# Patient Record
Sex: Female | Born: 1950 | Race: Black or African American | Hispanic: No | Marital: Married | Smoking: Never smoker
Health system: Southern US, Community
[De-identification: ages and names within clinical notes are randomized; demographics above are authoritative.]

## PROBLEM LIST (undated history)

## (undated) DIAGNOSIS — Z923 Personal history of irradiation: Secondary | ICD-10-CM

## (undated) DIAGNOSIS — I509 Heart failure, unspecified: Secondary | ICD-10-CM

## (undated) DIAGNOSIS — C801 Malignant (primary) neoplasm, unspecified: Secondary | ICD-10-CM

## (undated) DIAGNOSIS — I10 Essential (primary) hypertension: Secondary | ICD-10-CM

## (undated) DIAGNOSIS — E119 Type 2 diabetes mellitus without complications: Secondary | ICD-10-CM

## (undated) DIAGNOSIS — Z9221 Personal history of antineoplastic chemotherapy: Secondary | ICD-10-CM

## (undated) DIAGNOSIS — C50919 Malignant neoplasm of unspecified site of unspecified female breast: Secondary | ICD-10-CM

## (undated) HISTORY — PX: BREAST SURGERY: SHX581

## (undated) HISTORY — PX: BREAST BIOPSY: SHX20

## (undated) HISTORY — PX: BREAST LUMPECTOMY: SHX2

---

## 2004-04-18 ENCOUNTER — Ambulatory Visit (HOSPITAL_COMMUNITY): Admission: RE | Admit: 2004-04-18 | Discharge: 2004-04-18 | Payer: Self-pay | Admitting: Gastroenterology

## 2006-09-05 ENCOUNTER — Ambulatory Visit (HOSPITAL_COMMUNITY): Admission: RE | Admit: 2006-09-05 | Discharge: 2006-09-05 | Payer: Self-pay | Admitting: Gastroenterology

## 2010-06-19 ENCOUNTER — Encounter: Payer: Self-pay | Admitting: Hematology and Oncology

## 2012-07-09 ENCOUNTER — Other Ambulatory Visit (HOSPITAL_COMMUNITY): Payer: Self-pay | Admitting: Internal Medicine

## 2012-07-09 DIAGNOSIS — Z1231 Encounter for screening mammogram for malignant neoplasm of breast: Secondary | ICD-10-CM

## 2012-07-15 ENCOUNTER — Ambulatory Visit (HOSPITAL_COMMUNITY)
Admission: RE | Admit: 2012-07-15 | Discharge: 2012-07-15 | Disposition: A | Discharge status: Home / Self Care | Payer: Medicaid Other | Source: Ambulatory Visit | Attending: Internal Medicine | Admitting: Internal Medicine

## 2012-07-15 DIAGNOSIS — Z1231 Encounter for screening mammogram for malignant neoplasm of breast: Secondary | ICD-10-CM | POA: Insufficient documentation

## 2012-07-17 ENCOUNTER — Other Ambulatory Visit: Payer: Self-pay | Admitting: Internal Medicine

## 2012-07-17 DIAGNOSIS — R928 Other abnormal and inconclusive findings on diagnostic imaging of breast: Secondary | ICD-10-CM

## 2012-07-29 ENCOUNTER — Ambulatory Visit
Admission: RE | Admit: 2012-07-29 | Discharge: 2012-07-29 | Disposition: A | Discharge status: Home / Self Care | Payer: Medicaid Other | Source: Ambulatory Visit | Attending: Internal Medicine | Admitting: Internal Medicine

## 2012-07-29 DIAGNOSIS — R928 Other abnormal and inconclusive findings on diagnostic imaging of breast: Secondary | ICD-10-CM

## 2012-08-12 ENCOUNTER — Other Ambulatory Visit: Payer: Self-pay | Admitting: Internal Medicine

## 2012-08-12 DIAGNOSIS — N6310 Unspecified lump in the right breast, unspecified quadrant: Secondary | ICD-10-CM

## 2012-08-22 ENCOUNTER — Other Ambulatory Visit: Payer: Self-pay | Admitting: Internal Medicine

## 2012-08-22 ENCOUNTER — Ambulatory Visit
Admission: RE | Admit: 2012-08-22 | Discharge: 2012-08-22 | Disposition: A | Discharge status: Home / Self Care | Payer: Medicaid Other | Source: Ambulatory Visit | Attending: Internal Medicine | Admitting: Internal Medicine

## 2012-08-22 DIAGNOSIS — N6310 Unspecified lump in the right breast, unspecified quadrant: Secondary | ICD-10-CM

## 2014-03-03 ENCOUNTER — Other Ambulatory Visit: Payer: Self-pay

## 2014-03-03 DIAGNOSIS — Z1239 Encounter for other screening for malignant neoplasm of breast: Secondary | ICD-10-CM

## 2014-03-19 ENCOUNTER — Other Ambulatory Visit: Payer: Self-pay

## 2014-03-19 DIAGNOSIS — Z1231 Encounter for screening mammogram for malignant neoplasm of breast: Secondary | ICD-10-CM

## 2014-03-25 ENCOUNTER — Ambulatory Visit: Payer: Medicaid Other

## 2015-02-16 ENCOUNTER — Ambulatory Visit: Payer: Medicaid Other

## 2015-03-18 ENCOUNTER — Ambulatory Visit
Admission: RE | Admit: 2015-03-18 | Discharge: 2015-03-18 | Disposition: A | Discharge status: Home / Self Care | Payer: Medicaid Other | Source: Ambulatory Visit

## 2015-03-18 DIAGNOSIS — Z1231 Encounter for screening mammogram for malignant neoplasm of breast: Secondary | ICD-10-CM

## 2016-04-28 ENCOUNTER — Other Ambulatory Visit: Payer: Self-pay | Admitting: Family Medicine

## 2016-05-17 ENCOUNTER — Other Ambulatory Visit: Payer: Self-pay | Admitting: Family Medicine

## 2016-05-17 DIAGNOSIS — Z1231 Encounter for screening mammogram for malignant neoplasm of breast: Secondary | ICD-10-CM

## 2016-06-07 ENCOUNTER — Ambulatory Visit: Payer: Medicaid Other

## 2016-10-10 ENCOUNTER — Other Ambulatory Visit: Payer: Self-pay | Admitting: Family Medicine

## 2016-10-10 DIAGNOSIS — Z853 Personal history of malignant neoplasm of breast: Secondary | ICD-10-CM

## 2016-10-10 DIAGNOSIS — Z1231 Encounter for screening mammogram for malignant neoplasm of breast: Secondary | ICD-10-CM

## 2016-10-26 ENCOUNTER — Ambulatory Visit: Payer: Medicaid Other

## 2017-08-07 ENCOUNTER — Other Ambulatory Visit: Payer: Self-pay

## 2017-08-07 ENCOUNTER — Inpatient Hospital Stay (HOSPITAL_BASED_OUTPATIENT_CLINIC_OR_DEPARTMENT_OTHER)
Admission: EM | Admit: 2017-08-07 | Discharge: 2017-08-13 | DRG: 189 | Disposition: A | Discharge status: Home Health | Payer: Medicare Other | Attending: Internal Medicine | Admitting: Internal Medicine

## 2017-08-07 ENCOUNTER — Encounter (HOSPITAL_BASED_OUTPATIENT_CLINIC_OR_DEPARTMENT_OTHER): Payer: Self-pay | Admitting: Emergency Medicine

## 2017-08-07 ENCOUNTER — Emergency Department (HOSPITAL_BASED_OUTPATIENT_CLINIC_OR_DEPARTMENT_OTHER): Payer: Medicare Other

## 2017-08-07 DIAGNOSIS — Z7984 Long term (current) use of oral hypoglycemic drugs: Secondary | ICD-10-CM

## 2017-08-07 DIAGNOSIS — I4581 Long QT syndrome: Secondary | ICD-10-CM | POA: Diagnosis present

## 2017-08-07 DIAGNOSIS — R509 Fever, unspecified: Secondary | ICD-10-CM

## 2017-08-07 DIAGNOSIS — D72829 Elevated white blood cell count, unspecified: Secondary | ICD-10-CM

## 2017-08-07 DIAGNOSIS — M109 Gout, unspecified: Secondary | ICD-10-CM | POA: Diagnosis present

## 2017-08-07 DIAGNOSIS — R911 Solitary pulmonary nodule: Secondary | ICD-10-CM | POA: Diagnosis present

## 2017-08-07 DIAGNOSIS — D649 Anemia, unspecified: Secondary | ICD-10-CM | POA: Diagnosis present

## 2017-08-07 DIAGNOSIS — R4781 Slurred speech: Secondary | ICD-10-CM | POA: Diagnosis present

## 2017-08-07 DIAGNOSIS — Z853 Personal history of malignant neoplasm of breast: Secondary | ICD-10-CM | POA: Diagnosis not present

## 2017-08-07 DIAGNOSIS — N183 Chronic kidney disease, stage 3 unspecified: Secondary | ICD-10-CM | POA: Diagnosis present

## 2017-08-07 DIAGNOSIS — I509 Heart failure, unspecified: Secondary | ICD-10-CM | POA: Diagnosis present

## 2017-08-07 DIAGNOSIS — I130 Hypertensive heart and chronic kidney disease with heart failure and stage 1 through stage 4 chronic kidney disease, or unspecified chronic kidney disease: Secondary | ICD-10-CM | POA: Diagnosis present

## 2017-08-07 DIAGNOSIS — I340 Nonrheumatic mitral (valve) insufficiency: Secondary | ICD-10-CM | POA: Diagnosis not present

## 2017-08-07 DIAGNOSIS — W19XXXA Unspecified fall, initial encounter: Secondary | ICD-10-CM

## 2017-08-07 DIAGNOSIS — R591 Generalized enlarged lymph nodes: Secondary | ICD-10-CM | POA: Diagnosis present

## 2017-08-07 DIAGNOSIS — E875 Hyperkalemia: Secondary | ICD-10-CM | POA: Diagnosis present

## 2017-08-07 DIAGNOSIS — E785 Hyperlipidemia, unspecified: Secondary | ICD-10-CM | POA: Diagnosis present

## 2017-08-07 DIAGNOSIS — J209 Acute bronchitis, unspecified: Secondary | ICD-10-CM | POA: Diagnosis present

## 2017-08-07 DIAGNOSIS — E1122 Type 2 diabetes mellitus with diabetic chronic kidney disease: Secondary | ICD-10-CM | POA: Diagnosis present

## 2017-08-07 DIAGNOSIS — R0902 Hypoxemia: Secondary | ICD-10-CM | POA: Insufficient documentation

## 2017-08-07 DIAGNOSIS — E1121 Type 2 diabetes mellitus with diabetic nephropathy: Secondary | ICD-10-CM | POA: Diagnosis not present

## 2017-08-07 DIAGNOSIS — D631 Anemia in chronic kidney disease: Secondary | ICD-10-CM | POA: Diagnosis present

## 2017-08-07 DIAGNOSIS — Y92002 Bathroom of unspecified non-institutional (private) residence single-family (private) house as the place of occurrence of the external cause: Secondary | ICD-10-CM | POA: Diagnosis not present

## 2017-08-07 DIAGNOSIS — Z79899 Other long term (current) drug therapy: Secondary | ICD-10-CM | POA: Diagnosis not present

## 2017-08-07 DIAGNOSIS — J9601 Acute respiratory failure with hypoxia: Principal | ICD-10-CM

## 2017-08-07 DIAGNOSIS — Z6841 Body Mass Index (BMI) 40.0 and over, adult: Secondary | ICD-10-CM | POA: Diagnosis not present

## 2017-08-07 DIAGNOSIS — E669 Obesity, unspecified: Secondary | ICD-10-CM | POA: Diagnosis present

## 2017-08-07 DIAGNOSIS — I10 Essential (primary) hypertension: Secondary | ICD-10-CM | POA: Diagnosis present

## 2017-08-07 DIAGNOSIS — R7989 Other specified abnormal findings of blood chemistry: Secondary | ICD-10-CM | POA: Diagnosis present

## 2017-08-07 DIAGNOSIS — E1129 Type 2 diabetes mellitus with other diabetic kidney complication: Secondary | ICD-10-CM | POA: Diagnosis present

## 2017-08-07 DIAGNOSIS — R609 Edema, unspecified: Secondary | ICD-10-CM | POA: Diagnosis not present

## 2017-08-07 HISTORY — DX: Essential (primary) hypertension: I10

## 2017-08-07 HISTORY — DX: Malignant (primary) neoplasm, unspecified: C80.1

## 2017-08-07 HISTORY — DX: Heart failure, unspecified: I50.9

## 2017-08-07 HISTORY — DX: Type 2 diabetes mellitus without complications: E11.9

## 2017-08-07 LAB — CBC WITH DIFFERENTIAL/PLATELET
BASOS PCT: 0 %
Basophils Absolute: 0 10*3/uL (ref 0.0–0.1)
Eosinophils Absolute: 0 10*3/uL (ref 0.0–0.7)
Eosinophils Relative: 0 %
HEMATOCRIT: 35.3 % — AB (ref 36.0–46.0)
HEMOGLOBIN: 11.7 g/dL — AB (ref 12.0–15.0)
Lymphocytes Relative: 18 %
Lymphs Abs: 3.1 10*3/uL (ref 0.7–4.0)
MCH: 30.5 pg (ref 26.0–34.0)
MCHC: 33.1 g/dL (ref 30.0–36.0)
MCV: 91.9 fL (ref 78.0–100.0)
Monocytes Absolute: 1.3 10*3/uL — ABNORMAL HIGH (ref 0.1–1.0)
Monocytes Relative: 8 %
NEUTROS ABS: 12.7 10*3/uL — AB (ref 1.7–7.7)
NEUTROS PCT: 74 %
Platelets: 170 10*3/uL (ref 150–400)
RBC: 3.84 MIL/uL — AB (ref 3.87–5.11)
RDW: 14.6 % (ref 11.5–15.5)
WBC: 17.1 10*3/uL — ABNORMAL HIGH (ref 4.0–10.5)

## 2017-08-07 LAB — URINALYSIS, ROUTINE W REFLEX MICROSCOPIC
Glucose, UA: NEGATIVE mg/dL
Ketones, ur: NEGATIVE mg/dL
Leukocytes, UA: NEGATIVE
Nitrite: NEGATIVE
PROTEIN: 100 mg/dL — AB
pH: 5.5 (ref 5.0–8.0)

## 2017-08-07 LAB — COMPREHENSIVE METABOLIC PANEL
ALBUMIN: 3.6 g/dL (ref 3.5–5.0)
ALK PHOS: 65 U/L (ref 38–126)
ALT: 16 U/L (ref 14–54)
ANION GAP: 9 (ref 5–15)
AST: 35 U/L (ref 15–41)
BILIRUBIN TOTAL: 0.6 mg/dL (ref 0.3–1.2)
BUN: 22 mg/dL — ABNORMAL HIGH (ref 6–20)
CALCIUM: 8.5 mg/dL — AB (ref 8.9–10.3)
CO2: 28 mmol/L (ref 22–32)
Chloride: 99 mmol/L — ABNORMAL LOW (ref 101–111)
Creatinine, Ser: 1.7 mg/dL — ABNORMAL HIGH (ref 0.44–1.00)
GFR calc Af Amer: 35 mL/min — ABNORMAL LOW (ref >60)
GFR calc non Af Amer: 30 mL/min — ABNORMAL LOW (ref >60)
GLUCOSE: 124 mg/dL — AB (ref 65–99)
Potassium: 4 mmol/L (ref 3.5–5.1)
Sodium: 136 mmol/L (ref 135–145)
TOTAL PROTEIN: 8.1 g/dL (ref 6.5–8.1)

## 2017-08-07 LAB — URINALYSIS, MICROSCOPIC (REFLEX)

## 2017-08-07 LAB — BRAIN NATRIURETIC PEPTIDE: B Natriuretic Peptide: 220.7 pg/mL — ABNORMAL HIGH (ref 0.0–100.0)

## 2017-08-07 LAB — TROPONIN I: Troponin I: 0.05 ng/mL (ref <0.03)

## 2017-08-07 MED ORDER — OSELTAMIVIR PHOSPHATE 30 MG PO CAPS
30.0000 mg | ORAL_CAPSULE | Freq: Once | ORAL | Status: AC
  Administered 2017-08-07: 30 mg via ORAL
  Filled 2017-08-07: qty 1

## 2017-08-07 MED ORDER — OSELTAMIVIR PHOSPHATE 75 MG PO CAPS: 75.0000 mg | ORAL_CAPSULE | Freq: Once | ORAL | Status: DC

## 2017-08-07 MED ORDER — ORAL CARE MOUTH RINSE
15.0000 mL | Freq: Two times a day (BID) | OROMUCOSAL | Status: DC
  Administered 2017-08-08 – 2017-08-13 (×6): 15 mL via OROMUCOSAL

## 2017-08-07 NOTE — ED Notes (Signed)
Attempted to call report

## 2017-08-07 NOTE — ED Notes (Signed)
Pt's family is leaving. Daughter [place] can be reached at [phone]

## 2017-08-07 NOTE — ED Provider Notes (Signed)
[facility] EMERGENCY DEPARTMENT Provider Note   CSN: [id number] Arrival date & time: 08/07/17  1010     History   Chief Complaint Chief Complaint  Patient presents with  . Fall    HPI Alphonzo Grieve Lonon is a 67 y.o. female.  67 year old female with past medical history including CHF, type 2 diabetes mellitus, breast cancer, hypertension who presents with fall and nasal congestion.  Spouse states that yesterday she was in the bathroom and fell over to her side when trying to get off of the toilet.  She did not strike her head or lose consciousness.  Family member reports that she does not live with the patient but yesterday noticed that she was having slurred speech and seemed to be more confused than normal.  They took her to an outside hospital ER and waited many hours in the waiting room but were never evaluated, left without being seen.  She has continued to have these symptoms and yesterday started having nasal congestion and cough.  She denies any chest pain, shortness of breath, fevers, vomiting, or diarrhea.  No focal weakness/numbness.  Family states that she had the slurred speech and confusion problems last time that she was diagnosed with CHF. She denies any change in leg swelling.  LEVEL 5 CAVEAT DUE TO AMS   The history is provided by a relative, the patient and the spouse.    Past Medical History:  Diagnosis Date  . Cancer (HCC)    breast  . CHF (congestive heart failure) ([place])   . Diabetes mellitus without complication ([place])   . Hypertension     Patient Active Problem List   Diagnosis Date Noted  . Hypoxia 08/07/2017    Past Surgical History:  Procedure Laterality Date  . BREAST SURGERY      OB History    No data available       Home Medications    Prior to Admission medications   Medication Sig Start Date End Date Taking? Authorizing Provider  Albuterol Sulfate (PROAIR HFA IN) Inhale 1 puff into the lungs every 8 (eight) hours as needed.    Yes [provider]  amLODipine (NORVASC) 10 MG tablet Take 10 mg by mouth daily.   Yes [provider]  Aromatic Inhalants (VICKS VAPOR INHALER IN) Inhale into the lungs as needed.    Yes [provider]  furosemide (LASIX) 20 MG tablet Take 10 mg by mouth daily.    Yes [provider]  gabapentin (NEURONTIN) 300 MG capsule Take 300 mg by mouth daily. 05/18/17  Yes [provider]  lisinopril (PRINIVIL,ZESTRIL) 20 MG tablet Take 10 mg by mouth daily.  07/20/17  Yes [provider]  magnesium oxide (MAG-OX) 400 MG tablet Take 400 mg by mouth daily.   Yes [provider]  metoprolol (TOPROL-XL) 200 MG 24 hr tablet Take 200 mg by mouth daily. Take with or immediately following a meal.    Yes [provider]  rosuvastatin (CRESTOR) 5 MG tablet Take 5 mg by mouth every evening. 07/05/17  Yes [provider]  TRADJENTA 5 MG TABS tablet Take 5 mg by mouth daily. 07/09/17  Yes [provider]  ULORIC 40 MG tablet Take 40 mg by mouth daily. 07/22/17  Yes [provider]  Vitamin D, Ergocalciferol, (DRISDOL) 50000 units CAPS capsule Take 50,000 Units by mouth every 7 (seven) days.   Yes [provider]  benazepril-hydrochlorthiazide (LOTENSIN HCT) 10-12.5 MG tablet Take 1 tablet by  mouth daily.    [provider]  doxycycline (VIBRA-TABS) 100 MG tablet Take 100 mg by mouth 2 (two) times daily. For 7 days 07/10/17   [provider]    Family History History reviewed. No pertinent family history.  Social History Social History   Tobacco Use  . Smoking status: Never Smoker  . Smokeless tobacco: Never Used  Substance Use Topics  . Alcohol use: No    Frequency: Never  . Drug use: No     Allergies   Patient has no known allergies.   Review of Systems Review of Systems  Unable to perform ROS: Mental status change     Physical Exam Updated Vital Signs BP 116/73 (BP  Location: Right Wrist)   Pulse 88   Temp 99 F (37.2 C) (Oral)   Resp (!) 22   Ht 5\' 5"  (1.651 m)   Wt (!) 158.8 kg (350 lb)   SpO2 99%   BMI 58.24 kg/m   Physical Exam  Constitutional: She is oriented to person, place, and time. She appears well-developed and well-nourished. No distress.  Awake, alert  HENT:  Head: Normocephalic and atraumatic.  + nasal congestion  Eyes: Conjunctivae and EOM are normal. Pupils are equal, round, and reactive to light. Right eye exhibits discharge. Left eye exhibits discharge.  Neck: Neck supple.  Cardiovascular: Normal rate and regular rhythm.  Murmur heard. Pulmonary/Chest: Effort normal. No respiratory distress.  Diminished b/l  Abdominal: Soft. Bowel sounds are normal. She exhibits no distension. There is no tenderness.  Musculoskeletal: She exhibits edema (1+ pitting BLE).  Neurological: She is alert and oriented to person, place, and time. She has normal reflexes. No cranial nerve deficit. She exhibits normal muscle tone.  Nasal sounding speech but no dysarthria or word finding difficulty, normal finger-to-nose testing, negative pronator drift, no clonus 5/5 strength and normal sensation x all 4 extremities  Skin: Skin is warm and dry.  Nursing note and vitals reviewed.    ED Treatments / Results  Labs (all labs ordered are listed, but only abnormal results are displayed) Labs Reviewed  URINALYSIS, ROUTINE W REFLEX MICROSCOPIC - Abnormal; Notable for the following components:      Result Value   Specific Gravity, Urine >1.030 (*)    Hgb urine dipstick TRACE (*)    Bilirubin Urine SMALL (*)    Protein, ur 100 (*)    All other components within normal limits  BRAIN NATRIURETIC PEPTIDE - Abnormal; Notable for the following components:   B Natriuretic Peptide 220.7 (*)    All other components within normal limits  CBC WITH DIFFERENTIAL/PLATELET - Abnormal; Notable for the following components:   WBC 17.1 (*)    RBC 3.84 (*)     Hemoglobin 11.7 (*)    HCT 35.3 (*)    Neutro Abs 12.7 (*)    Monocytes Absolute 1.3 (*)    All other components within normal limits  TROPONIN I - Abnormal; Notable for the following components:   Troponin I 0.05 (*)    All other components within normal limits  URINALYSIS, MICROSCOPIC (REFLEX) - Abnormal; Notable for the following components:   Bacteria, UA FEW (*)    Squamous Epithelial / LPF 0-5 (*)    All other components within normal limits  COMPREHENSIVE METABOLIC PANEL - Abnormal; Notable for the following components:   Chloride 99 (*)    Glucose, Bld 124 (*)    BUN 22 (*)    Creatinine, Ser 1.70 (*)  Calcium 8.5 (*)    GFR calc non Af Amer 30 (*)    GFR calc Af Amer 35 (*)    All other components within normal limits  URINE CULTURE  RESPIRATORY PANEL BY PCR  INFLUENZA PANEL BY PCR (TYPE A & B)    EKG  EKG Interpretation  Date/Time:  Tuesday August 07 2017 11:16:02 EDT Ventricular Rate:  92 PR Interval:    QRS Duration: 79 QT Interval:  415 QTC Calculation: 514 R Axis:   115 Text Interpretation:  Sinus rhythm Probable right ventricular hypertrophy Nonspecific T abnormalities, lateral leads Prolonged QT interval No previous ECGs available Confirmed by Theotis Burrow [phone]) on 08/07/2017 11:55:16 AM       Radiology Dg Chest 2 View  Result Date: 08/07/2017 CLINICAL DATA:  Recent fall. Chest pain. Cough and chest congestion. EXAM: CHEST - 2 VIEW COMPARISON:  02/15/2015 FINDINGS: The heart size and mediastinal contours are within normal limits. Aortic atherosclerosis. Both lungs are clear. Surgical clips again seen in right axilla. The visualized skeletal structures are unremarkable. IMPRESSION: No active cardiopulmonary disease. Electronically Signed   By: Earle Gell M.D.   On: 08/07/2017 10:44   Ct Head Wo Contrast  Result Date: 08/07/2017 CLINICAL DATA:  Pain following fall.  History of breast carcinoma EXAM: CT HEAD WITHOUT CONTRAST TECHNIQUE: Contiguous  axial images were obtained from the base of the skull through the vertex without intravenous contrast. COMPARISON:  August 06, 2017. FINDINGS: Brain: The ventricles are normal in size and configuration. There is no intracranial mass, hemorrhage, extra-axial fluid collection, or midline shift. Patchy small vessel disease in the centra semiovale bilaterally is stable. There is no new gray-white compartment lesion evident. No acute infarct appreciable. Mild basal ganglia calcification is felt to be physiologic in this age group. Vascular: No hyperdense vessel. There is calcification in each carotid siphon region. Skull: The bony calvarium appears intact. Sinuses/Orbits: There is opacification of most of the left maxillary antrum. There is mucosal thickening to a much lesser extent in the right maxillary antrum. There is opacification of several ethmoid air cells bilaterally. There is mild patchy opacity in the right sphenoid sinus with mucosal thickening in both anterior sphenoid regions. Orbits appear symmetric bilaterally. Other: Mastoid air cells are clear. IMPRESSION: Patchy supratentorial small vessel disease. No acute infarct evident. No mass or hemorrhage. Foci of arteriovascular calcification noted. There are multiple foci paranasal sinus disease, stable from 1 day prior. Electronically Signed   By: Lowella Grip III M.D.   On: 08/07/2017 12:16   Ct Chest Wo Contrast  Result Date: 08/07/2017 CLINICAL DATA:  Shortness of breath today.  Fell yesterday. EXAM: CT CHEST WITHOUT CONTRAST TECHNIQUE: Multidetector CT imaging of the chest was performed following the standard protocol without IV contrast. COMPARISON:  Chest radiograph August 07, 2017 and CT chest February 16, 2015 FINDINGS: CARDIOVASCULAR: Heart size is normal. No pericardial collections. Severe coronary artery calcifications. Thoracic aorta is normal course and caliber, moderate calcific atherosclerosis. Main pulmonary is enlarged at 4.5 cm seen  with chronic pulmonary arterial hypertension. Pulmonary embolism not excluded by noncontrast CT. MEDIASTINUM/NODES: No mediastinal mass. No lymphadenopathy by CT size criteria though sensitivity decreased without intravenous contrast. Similar subcentimeter mediastinal lymph nodes. Normal appearance of thoracic esophagus though not tailored for evaluation. LUNGS/PLEURA: Tracheobronchial tree is patent, no pneumothorax. No pleural effusions, focal consolidations, or masses. Mild bronchiectasis versus respiratory motion artifact LEFT lower lobe. Lingular and LEFT lower lobe atelectasis/scarring. New solid irregular 9 mm and adjacent  sub solid 4 mm pulmonary nodules RIGHT upper lobe (series 4, image 55/141). Stable appearance of 4 mm LEFT upper lobe sub solid pulmonary nodule. UPPER ABDOMEN: Nonacute.  Very small hiatal hernia. MUSCULOSKELETAL: Nonacute. Spiculated 12 x 23 mm RIGHT anterior chest wall mass was 13 x 32 mm with surrounding calcifications versus surgical clips, possible seroma. Surgical clips RIGHT axilla. IMPRESSION: 1. No acute cardiopulmonary process or CT findings of thoracic trauma. 2. **An incidental finding of potential clinical significance has been found. New RIGHT upper lobe pulmonary nodules measuring to 9 mm. Given history of RIGHT breast cancer, metastatic disease not excluded. Recommend follow-up. Aortic Atherosclerosis (ICD10-I70.0). Electronically Signed   By: Elon Alas M.D.   On: 08/07/2017 15:14    Procedures Procedures (including critical care time)  Medications Ordered in ED Medications  oseltamivir (TAMIFLU) capsule 30 mg (not administered)     Initial Impression / Assessment and Plan / ED Course  I have reviewed the triage vital signs and the nursing notes.  Pertinent labs & imaging results that were available during my care of the patient were reviewed by me and considered in my medical decision making (see chart for details).    Pt without obvious focal  neuro deficits on exam. Pulse ox dipped to 88%, placed on supplemental O2. CXR negative acute. EKG without ischemic changes. Labs show Cr 1.7, she does have h/o CKD. UA without infection. CXR negative acute.  Trop 0.05, BNP 220. WBC 17.  Couldn't obtain a contrasted CT to rule out PE but did obtain noncontrasted CT chest which was negative for infiltrate or pulmonary edema.  Based on this information, no obvious source of her leukocytosis or her hypoxia but she continues to require 3 L of oxygen to maintain saturations while awake.  I do suspect a component of sleep apnea but her hypoxia persists even while awake and talking to me.  I gave first dose of Tamiflu because her nasal congestion and cough could represent early influenza.  RVP and flu tests are pending.  Discussed admission with hospitalist, Dr. Maryland Pink, and pt will be transferred to [facility] for further workup and treatment. Final Clinical Impressions(s) / ED Diagnoses   Final diagnoses:  None    ED Discharge Orders    None       Jaydyn Bozzo, Wenda Overland, MD 08/07/17 1646

## 2017-08-07 NOTE — ED Triage Notes (Signed)
Patient states that she was in the bathroom yesterday going to the bathroom when she fell over. THe patient denies hitting her head and denies any LOC - Husband was at the bathroom and helped the patient up. She also is having some nasal congestion that she would like to be looked at

## 2017-08-08 ENCOUNTER — Inpatient Hospital Stay (HOSPITAL_COMMUNITY): Payer: Medicare Other

## 2017-08-08 ENCOUNTER — Ambulatory Visit (HOSPITAL_COMMUNITY)
Admit: 2017-08-08 | Discharge: 2017-08-08 | Disposition: A | Discharge status: Home / Self Care | Payer: Medicare Other | Attending: Internal Medicine | Admitting: Internal Medicine

## 2017-08-08 ENCOUNTER — Encounter (HOSPITAL_COMMUNITY): Payer: Self-pay | Admitting: Internal Medicine

## 2017-08-08 DIAGNOSIS — J9601 Acute respiratory failure with hypoxia: Secondary | ICD-10-CM

## 2017-08-08 DIAGNOSIS — E1121 Type 2 diabetes mellitus with diabetic nephropathy: Secondary | ICD-10-CM

## 2017-08-08 DIAGNOSIS — W19XXXA Unspecified fall, initial encounter: Secondary | ICD-10-CM

## 2017-08-08 DIAGNOSIS — I10 Essential (primary) hypertension: Secondary | ICD-10-CM

## 2017-08-08 DIAGNOSIS — I340 Nonrheumatic mitral (valve) insufficiency: Secondary | ICD-10-CM

## 2017-08-08 DIAGNOSIS — D72829 Elevated white blood cell count, unspecified: Secondary | ICD-10-CM

## 2017-08-08 DIAGNOSIS — N183 Chronic kidney disease, stage 3 unspecified: Secondary | ICD-10-CM | POA: Diagnosis present

## 2017-08-08 DIAGNOSIS — D649 Anemia, unspecified: Secondary | ICD-10-CM | POA: Diagnosis present

## 2017-08-08 DIAGNOSIS — R609 Edema, unspecified: Secondary | ICD-10-CM

## 2017-08-08 DIAGNOSIS — E1129 Type 2 diabetes mellitus with other diabetic kidney complication: Secondary | ICD-10-CM | POA: Diagnosis present

## 2017-08-08 LAB — RESPIRATORY PANEL BY PCR
Adenovirus: NOT DETECTED
Bordetella pertussis: NOT DETECTED
CORONAVIRUS OC43-RVPPCR: NOT DETECTED
Chlamydophila pneumoniae: NOT DETECTED
Coronavirus 229E: NOT DETECTED
Coronavirus HKU1: NOT DETECTED
Coronavirus NL63: NOT DETECTED
INFLUENZA A-RVPPCR: NOT DETECTED
INFLUENZA B-RVPPCR: NOT DETECTED
METAPNEUMOVIRUS-RVPPCR: NOT DETECTED
Mycoplasma pneumoniae: NOT DETECTED
PARAINFLUENZA VIRUS 1-RVPPCR: NOT DETECTED
PARAINFLUENZA VIRUS 2-RVPPCR: NOT DETECTED
PARAINFLUENZA VIRUS 3-RVPPCR: NOT DETECTED
PARAINFLUENZA VIRUS 4-RVPPCR: NOT DETECTED
RESPIRATORY SYNCYTIAL VIRUS-RVPPCR: NOT DETECTED
RHINOVIRUS / ENTEROVIRUS - RVPPCR: NOT DETECTED

## 2017-08-08 LAB — URINE CULTURE
Culture: NO GROWTH
Special Requests: NORMAL

## 2017-08-08 LAB — CBC
HCT: 35.1 % — ABNORMAL LOW (ref 36.0–46.0)
HEMOGLOBIN: 11.1 g/dL — AB (ref 12.0–15.0)
MCH: 30.2 pg (ref 26.0–34.0)
MCHC: 31.6 g/dL (ref 30.0–36.0)
MCV: 95.4 fL (ref 78.0–100.0)
Platelets: 189 10*3/uL (ref 150–400)
RBC: 3.68 MIL/uL — AB (ref 3.87–5.11)
RDW: 15 % (ref 11.5–15.5)
WBC: 12.8 10*3/uL — AB (ref 4.0–10.5)

## 2017-08-08 LAB — TROPONIN I
TROPONIN I: 0.05 ng/mL — AB (ref <0.03)
TROPONIN I: 0.03 ng/mL — AB (ref <0.03)
Troponin I: 0.03 ng/mL (ref <0.03)

## 2017-08-08 LAB — BASIC METABOLIC PANEL
ANION GAP: 11 (ref 5–15)
BUN: 25 mg/dL — AB (ref 6–20)
CHLORIDE: 102 mmol/L (ref 101–111)
CO2: 27 mmol/L (ref 22–32)
Calcium: 8.7 mg/dL — ABNORMAL LOW (ref 8.9–10.3)
Creatinine, Ser: 1.76 mg/dL — ABNORMAL HIGH (ref 0.44–1.00)
GFR calc Af Amer: 34 mL/min — ABNORMAL LOW (ref >60)
GFR, EST NON AFRICAN AMERICAN: 29 mL/min — AB (ref >60)
Glucose, Bld: 142 mg/dL — ABNORMAL HIGH (ref 65–99)
POTASSIUM: 4 mmol/L (ref 3.5–5.1)
SODIUM: 140 mmol/L (ref 135–145)

## 2017-08-08 LAB — ECHOCARDIOGRAM COMPLETE
Height: 65 in
WEIGHTICAEL: 4504.44 [oz_av]

## 2017-08-08 LAB — HIV ANTIBODY (ROUTINE TESTING W REFLEX): HIV SCREEN 4TH GENERATION: NONREACTIVE

## 2017-08-08 LAB — GLUCOSE, CAPILLARY
GLUCOSE-CAPILLARY: 107 mg/dL — AB (ref 65–99)
Glucose-Capillary: 124 mg/dL — ABNORMAL HIGH (ref 65–99)
Glucose-Capillary: 136 mg/dL — ABNORMAL HIGH (ref 65–99)
Glucose-Capillary: 94 mg/dL (ref 65–99)

## 2017-08-08 LAB — TSH: TSH: 2.26 u[IU]/mL (ref 0.350–4.500)

## 2017-08-08 LAB — MAGNESIUM: Magnesium: 1.8 mg/dL (ref 1.7–2.4)

## 2017-08-08 MED ORDER — LISINOPRIL 10 MG PO TABS
10.0000 mg | ORAL_TABLET | Freq: Every day | ORAL | Status: DC
  Administered 2017-08-08 – 2017-08-10 (×3): 10 mg via ORAL
  Filled 2017-08-08 (×3): qty 1

## 2017-08-08 MED ORDER — LINAGLIPTIN 5 MG PO TABS
5.0000 mg | ORAL_TABLET | Freq: Every day | ORAL | Status: DC
  Administered 2017-08-08 – 2017-08-13 (×6): 5 mg via ORAL
  Filled 2017-08-08 (×6): qty 1

## 2017-08-08 MED ORDER — ALBUTEROL SULFATE (2.5 MG/3ML) 0.083% IN NEBU: 2.5000 mg | INHALATION_SOLUTION | RESPIRATORY_TRACT | Status: DC | PRN

## 2017-08-08 MED ORDER — METOPROLOL SUCCINATE ER 100 MG PO TB24
200.0000 mg | ORAL_TABLET | Freq: Every day | ORAL | Status: DC
  Administered 2017-08-08 – 2017-08-13 (×6): 200 mg via ORAL
  Filled 2017-08-08 (×6): qty 2

## 2017-08-08 MED ORDER — ONDANSETRON HCL 4 MG PO TABS: 4.0000 mg | ORAL_TABLET | Freq: Four times a day (QID) | ORAL | Status: DC | PRN

## 2017-08-08 MED ORDER — ALBUTEROL SULFATE HFA 108 (90 BASE) MCG/ACT IN AERS: 2.0000 | INHALATION_SPRAY | RESPIRATORY_TRACT | Status: DC | PRN

## 2017-08-08 MED ORDER — GABAPENTIN 300 MG PO CAPS
300.0000 mg | ORAL_CAPSULE | Freq: Every day | ORAL | Status: DC
  Administered 2017-08-08 – 2017-08-13 (×6): 300 mg via ORAL
  Filled 2017-08-08 (×6): qty 1

## 2017-08-08 MED ORDER — BENAZEPRIL-HYDROCHLOROTHIAZIDE 10-12.5 MG PO TABS: 1.0000 | ORAL_TABLET | Freq: Every day | ORAL | Status: DC

## 2017-08-08 MED ORDER — FUROSEMIDE 20 MG PO TABS
10.0000 mg | ORAL_TABLET | Freq: Every day | ORAL | Status: DC
  Administered 2017-08-08 – 2017-08-10 (×3): 10 mg via ORAL
  Filled 2017-08-08 (×3): qty 1

## 2017-08-08 MED ORDER — FEBUXOSTAT 40 MG PO TABS
40.0000 mg | ORAL_TABLET | Freq: Every day | ORAL | Status: DC
  Administered 2017-08-08 – 2017-08-13 (×6): 40 mg via ORAL
  Filled 2017-08-08 (×6): qty 1

## 2017-08-08 MED ORDER — HEPARIN SODIUM (PORCINE) 5000 UNIT/ML IJ SOLN
5000.0000 [IU] | Freq: Three times a day (TID) | INTRAMUSCULAR | Status: DC
  Administered 2017-08-08 – 2017-08-13 (×16): 5000 [IU] via SUBCUTANEOUS
  Filled 2017-08-08 (×16): qty 1

## 2017-08-08 MED ORDER — ONDANSETRON HCL 4 MG/2ML IJ SOLN: 4.0000 mg | Freq: Four times a day (QID) | INTRAMUSCULAR | Status: DC | PRN

## 2017-08-08 MED ORDER — ACETAMINOPHEN 650 MG RE SUPP: 650.0000 mg | Freq: Four times a day (QID) | RECTAL | Status: DC | PRN

## 2017-08-08 MED ORDER — INSULIN ASPART 100 UNIT/ML ~~LOC~~ SOLN
0.0000 [IU] | Freq: Three times a day (TID) | SUBCUTANEOUS | Status: DC
  Administered 2017-08-08 – 2017-08-12 (×9): 1 [IU] via SUBCUTANEOUS

## 2017-08-08 MED ORDER — ROSUVASTATIN CALCIUM 5 MG PO TABS
5.0000 mg | ORAL_TABLET | Freq: Every evening | ORAL | Status: DC
  Administered 2017-08-08 – 2017-08-12 (×5): 5 mg via ORAL
  Filled 2017-08-08 (×5): qty 1

## 2017-08-08 MED ORDER — AMLODIPINE BESYLATE 10 MG PO TABS
10.0000 mg | ORAL_TABLET | Freq: Every day | ORAL | Status: DC
  Administered 2017-08-08 – 2017-08-13 (×5): 10 mg via ORAL
  Filled 2017-08-08 (×5): qty 1

## 2017-08-08 MED ORDER — MAGNESIUM OXIDE 400 (241.3 MG) MG PO TABS
400.0000 mg | ORAL_TABLET | Freq: Every day | ORAL | Status: DC
  Administered 2017-08-08 – 2017-08-13 (×6): 400 mg via ORAL
  Filled 2017-08-08 (×6): qty 1

## 2017-08-08 MED ORDER — ACETAMINOPHEN 325 MG PO TABS
650.0000 mg | ORAL_TABLET | Freq: Four times a day (QID) | ORAL | Status: DC | PRN
  Administered 2017-08-09 – 2017-08-10 (×3): 650 mg via ORAL
  Filled 2017-08-08 (×3): qty 2

## 2017-08-08 NOTE — Evaluation (Signed)
Physical Therapy Evaluation Patient Details Name: Tonya Moon MRN: [id number] DOB: 08-Oct-1950 Today's Date: 08/08/2017   History of Present Illness  67 y.o. female past medical history of breast cancer on remission, chronic kidney disease stage III, diabetes mellitus type 2, CHF and admitted with slurred speech and Acute respiratory failure with hypoxia   Clinical Impression  Pt admitted with above diagnosis. Pt currently with functional limitations due to the deficits listed below (see PT Problem List). Pt will benefit from skilled PT to increase their independence and safety with mobility to allow discharge to the venue listed below.  Pt ambulated in hallway with [facility] and reports fatigue and generalized weakness.  Pt reports she needs to be more active and agreeable to f/u PT upon d/c.  SpO2 93% on room air at rest and 88-92% on room air during ambulation.  Pt SpO2 95% on room air upon leaving room.  RN notified of saturations and pt left off oxygen nasal cannula.     Follow Up Recommendations Home health PT    Equipment Recommendations  Cane    Recommendations for Other Services       Precautions / Restrictions Precautions Precautions: Fall      Mobility  Bed Mobility Overal bed mobility: Needs Assistance Bed Mobility: Supine to Sit     Supine to sit: Min guard;HOB elevated     General bed mobility comments: provided a hand for pt to self assist trunk upright  Transfers Overall transfer level: Needs assistance Equipment used: Straight cane Transfers: Sit to/from Stand Sit to Stand: Min guard         General transfer comment: min/guard for safety  Ambulation/Gait Ambulation/Gait assistance: Min guard Ambulation Distance (Feet): 120 Feet Assistive device: Straight cane Gait Pattern/deviations: Step-through pattern;Decreased stride length     General Gait Details: pt ambulated in hallway and slow mildly unsteady gait however no overt LOB, pt also used her SPC  from home which needs new rubber bottom, SpO2 88-92% on room air during gait  Stairs            Wheelchair Mobility    Modified Rankin (Stroke Patients Only)       Balance Overall balance assessment: History of Falls(fall prior to arrival, pt unable to recall reason for fall, denies LOC and hitting head)                                           Pertinent Vitals/Pain Pain Assessment: No/denies pain    Home Living Family/patient expects to be discharged to:: Private residence Living Arrangements: Alone Available Help at Discharge: Family Type of Home: Apartment       Home Layout: One level Home Equipment: Environmental consultant - 2 wheels;Cane - single point      Prior Function Level of Independence: Independent with assistive device(s)         Comments: typically uses SPC, SPC rubber piece now has hole so metal is touching floor instead of rubber     Hand Dominance        Extremity/Trunk Assessment        Lower Extremity Assessment Lower Extremity Assessment: Generalized weakness(no focal weakness observed, pt reports general weakness, denies numbness/tingling)       Communication   Communication: No difficulties  Cognition Arousal/Alertness: Awake/alert Behavior During Therapy: WFL for tasks assessed/performed Overall Cognitive Status: Within Functional Limits for tasks assessed  General Comments: slow to respond at times however responses appropriate      General Comments      Exercises     Assessment/Plan    PT Assessment Patient needs continued PT services  PT Problem List Decreased strength;Decreased mobility;Decreased activity tolerance;Decreased balance;Decreased knowledge of use of DME;Cardiopulmonary status limiting activity       PT Treatment Interventions DME instruction;Therapeutic activities;Therapeutic exercise;Gait training;Patient/family education;Stair training;Functional  mobility training;Balance training    PT Goals (Current goals can be found in the Care Plan section)  Acute Rehab PT Goals PT Goal Formulation: With patient Time For Goal Achievement: 08/15/17 Potential to Achieve Goals: Good    Frequency Min 3X/week   Barriers to discharge        Co-evaluation               AM-PAC PT "6 Clicks" Daily Activity  Outcome Measure Difficulty turning over in bed (including adjusting bedclothes, sheets and blankets)?: A Little Difficulty moving from lying on back to sitting on the side of the bed? : Unable Difficulty sitting down on and standing up from a chair with arms (e.g., wheelchair, bedside commode, etc,.)?: Unable Help needed moving to and from a bed to chair (including a wheelchair)?: A Little Help needed walking in hospital room?: A Little Help needed climbing 3-5 steps with a railing? : A Little 6 Click Score: 14    End of Session   Activity Tolerance: Patient tolerated treatment well Patient left: in chair;with chair alarm set;with call bell/phone within reach Nurse Communication: Mobility status PT Visit Diagnosis: Difficulty in walking, not elsewhere classified (R26.2)    Time: 4332-9518 PT Time Calculation (min) (ACUTE ONLY): 19 min   Charges:   PT Evaluation $PT Eval Low Complexity: 1 Low     PT G CodesCarmelia Bake, PT, DPT 08/08/2017 Pager: [phone]  York Ram E 08/08/2017, 3:35 PM

## 2017-08-08 NOTE — Progress Notes (Signed)
*  PRELIMINARY RESULTS* Echocardiogram 2D Echocardiogram has been performed.  Tonya Moon 08/08/2017, 10:15 AM

## 2017-08-08 NOTE — Progress Notes (Signed)
[facility] PROGRESS NOTE    Progress Note  Salisha Bardsley  WJX:[id number] DOB: 1950/06/19 DOA: 08/07/2017 PCP: [facility]     Brief Narrative:   Tonya Moon is an 67 y.o. female past medical history of breast cancer on remission, chronic kidney disease stage III, diabetes mellitus type 2, who was brought to the ED after a fall that happened 2 days prior to admission, the patient denies any loss of consciousness she was held by her husband.  But her husband relate there was some slurred speech him a CT head scan of the head was unremarkable  Assessment/Plan:   Acute respiratory failure with hypoxia ([facility]) VQ scan is pending, check lower extremity Doppler to rule out a DVT. He has been on oxygen in the hospital and has been greater than 97% we will discontinue oxygen. Is a single episode of 88% on room air we will discontinue oxygen.,  Awaiting 2D echo. Cardiac biomarkers have mildly elevated but mostly remained flat, she denies any chest pain  this elevation is likely due to chronic renal disease.  Possible slurred speech: MRI of the brain pending Physical therapy consult pending.  Type 2 diabetes mellitus with renal manifestations (HCC) Has been fairly controlled, has not received insulin continue sliding scale insulin for n.p.o. 6 check CBGs every 4 hours.  Pulmonary nodule on CT scan of the chest will need further workup as an outpatient  CKD (chronic kidney disease) stage 3, GFR 30-59 ml/min (HCC) Has remained at baseline.  Essential hypertension Continue ACE inhibitor, beta-blocker and amlodipine. Blood pressure has remained stable.  Normocytic normochromic anemia  DVT prophylaxis: lovenox Family Communication:daughter Disposition Plan/Barrier to D/C: home in am Code Status:     Code Status Orders  (From admission, onward)        Start     Ordered   08/08/17 0313  Full code  Continuous     08/08/17 0316    Code Status History    Date  Active Date Inactive Code Status Order ID Comments User Context   This patient has a current code status but no historical code status.        IV Access:    Peripheral IV   Procedures and diagnostic studies:   Dg Chest 2 View  Result Date: 08/07/2017 CLINICAL DATA:  Recent fall. Chest pain. Cough and chest congestion. EXAM: CHEST - 2 VIEW COMPARISON:  02/15/2015 FINDINGS: The heart size and mediastinal contours are within normal limits. Aortic atherosclerosis. Both lungs are clear. Surgical clips again seen in right axilla. The visualized skeletal structures are unremarkable. IMPRESSION: No active cardiopulmonary disease. Electronically Signed   By: Earle Gell M.D.   On: 08/07/2017 10:44   Ct Head Wo Contrast  Result Date: 08/07/2017 CLINICAL DATA:  Pain following fall.  History of breast carcinoma EXAM: CT HEAD WITHOUT CONTRAST TECHNIQUE: Contiguous axial images were obtained from the base of the skull through the vertex without intravenous contrast. COMPARISON:  August 06, 2017. FINDINGS: Brain: The ventricles are normal in size and configuration. There is no intracranial mass, hemorrhage, extra-axial fluid collection, or midline shift. Patchy small vessel disease in the centra semiovale bilaterally is stable. There is no new gray-white compartment lesion evident. No acute infarct appreciable. Mild basal ganglia calcification is felt to be physiologic in this age group. Vascular: No hyperdense vessel. There is calcification in each carotid siphon region. Skull: The bony calvarium appears intact. Sinuses/Orbits: There is opacification of most of the left maxillary antrum. There  is mucosal thickening to a much lesser extent in the right maxillary antrum. There is opacification of several ethmoid air cells bilaterally. There is mild patchy opacity in the right sphenoid sinus with mucosal thickening in both anterior sphenoid regions. Orbits appear symmetric bilaterally. Other: Mastoid air cells  are clear. IMPRESSION: Patchy supratentorial small vessel disease. No acute infarct evident. No mass or hemorrhage. Foci of arteriovascular calcification noted. There are multiple foci paranasal sinus disease, stable from 1 day prior. Electronically Signed   By: Lowella Grip III M.D.   On: 08/07/2017 12:16   Ct Chest Wo Contrast  Result Date: 08/07/2017 CLINICAL DATA:  Shortness of breath today.  Fell yesterday. EXAM: CT CHEST WITHOUT CONTRAST TECHNIQUE: Multidetector CT imaging of the chest was performed following the standard protocol without IV contrast. COMPARISON:  Chest radiograph August 07, 2017 and CT chest February 16, 2015 FINDINGS: CARDIOVASCULAR: Heart size is normal. No pericardial collections. Severe coronary artery calcifications. Thoracic aorta is normal course and caliber, moderate calcific atherosclerosis. Main pulmonary is enlarged at 4.5 cm seen with chronic pulmonary arterial hypertension. Pulmonary embolism not excluded by noncontrast CT. MEDIASTINUM/NODES: No mediastinal mass. No lymphadenopathy by CT size criteria though sensitivity decreased without intravenous contrast. Similar subcentimeter mediastinal lymph nodes. Normal appearance of thoracic esophagus though not tailored for evaluation. LUNGS/PLEURA: Tracheobronchial tree is patent, no pneumothorax. No pleural effusions, focal consolidations, or masses. Mild bronchiectasis versus respiratory motion artifact LEFT lower lobe. Lingular and LEFT lower lobe atelectasis/scarring. New solid irregular 9 mm and adjacent sub solid 4 mm pulmonary nodules RIGHT upper lobe (series 4, image 55/141). Stable appearance of 4 mm LEFT upper lobe sub solid pulmonary nodule. UPPER ABDOMEN: Nonacute.  Very small hiatal hernia. MUSCULOSKELETAL: Nonacute. Spiculated 12 x 23 mm RIGHT anterior chest wall mass was 13 x 32 mm with surrounding calcifications versus surgical clips, possible seroma. Surgical clips RIGHT axilla. IMPRESSION: 1. No acute  cardiopulmonary process or CT findings of thoracic trauma. 2. **An incidental finding of potential clinical significance has been found. New RIGHT upper lobe pulmonary nodules measuring to 9 mm. Given history of RIGHT breast cancer, metastatic disease not excluded. Recommend follow-up. Aortic Atherosclerosis (ICD10-I70.0). Electronically Signed   By: Elon Alas M.D.   On: 08/07/2017 15:14     Medical Consultants:    None.  Anti-Infectives:   None  Subjective:    [name] daughter is at bedside relates she has no further slurred speech, patient relates she has had no further chest pain, no further shortness of breath.  Feels close to baseline.  Objective:    Vitals:   08/07/17 2045 08/07/17 2143 08/08/17 0432 08/08/17 0500  BP:  128/86 104/64   Pulse: 80 93 89   Resp: 19 20 18    Temp:  98.2 F (36.8 C) 99.7 F (37.6 C)   TempSrc:  Oral Oral   SpO2: 97% 100% 100%   Weight:  128.2 kg (282 lb 10.1 oz)  127.7 kg (281 lb 8.4 oz)  Height:  5\' 5"  (1.651 m)      Intake/Output Summary (Last 24 hours) at 08/08/2017 0919 Last data filed at 08/08/2017 0600 Gross per 24 hour  Intake 240 ml  Output 1 ml  Net 239 ml   Filed Weights   08/07/17 1021 08/07/17 2143 08/08/17 0500  Weight: (!) 158.8 kg (350 lb) 128.2 kg (282 lb 10.1 oz) 127.7 kg (281 lb 8.4 oz)    Exam: General exam: In no acute distress. Respiratory system: Good air movement  and clear to auscultation. Cardiovascular system: S1 & S2 heard, RRR. No JVD.  Gastrointestinal system: Abdomen is nondistended, soft and nontender.  Central nervous system: Awake alert and oriented x3 nonfocal on physical exam Extremities: No pedal edema. Skin: No rashes, lesions or ulcers   Data Reviewed:    Labs: Basic Metabolic Panel: Recent Labs  Lab 08/07/17 1331 08/08/17 0517  NA 136 140  K 4.0 4.0  CL 99* 102  CO2 28 27  GLUCOSE 124* 142*  BUN 22* 25*  CREATININE 1.70* 1.76*  CALCIUM 8.5* 8.7*  MG  --  1.8     GFR Estimated Creatinine Clearance: 42.9 mL/min (A) (by C-G formula based on SCr of 1.76 mg/dL (H)). Liver Function Tests: Recent Labs  Lab 08/07/17 1331  AST 35  ALT 16  ALKPHOS 65  BILITOT 0.6  PROT 8.1  ALBUMIN 3.6   No results for input(s): LIPASE, AMYLASE in the last 168 hours. No results for input(s): AMMONIA in the last 168 hours. Coagulation profile No results for input(s): INR, PROTIME in the last 168 hours.  CBC: Recent Labs  Lab 08/07/17 1220 08/08/17 0517  WBC 17.1* 12.8*  NEUTROABS 12.7*  --   HGB 11.7* 11.1*  HCT 35.3* 35.1*  MCV 91.9 95.4  PLT 170 189   Cardiac Enzymes: Recent Labs  Lab 08/07/17 1220 08/08/17 0517  TROPONINI 0.05* 0.05*   BNP (last 3 results) No results for input(s): PROBNP in the last 8760 hours. CBG: Recent Labs  Lab 08/08/17 0729  GLUCAP 124*   D-Dimer: No results for input(s): DDIMER in the last 72 hours. Hgb A1c: No results for input(s): HGBA1C in the last 72 hours. Lipid Profile: No results for input(s): CHOL, HDL, LDLCALC, TRIG, CHOLHDL, LDLDIRECT in the last 72 hours. Thyroid function studies: Recent Labs    08/08/17 0517  TSH 2.260   Anemia work up: No results for input(s): VITAMINB12, FOLATE, FERRITIN, TIBC, IRON, RETICCTPCT in the last 72 hours. Sepsis Labs: Recent Labs  Lab 08/07/17 1220 08/08/17 0517  WBC 17.1* 12.8*   Microbiology Recent Results (from the past 240 hour(s))  Respiratory Panel by PCR     Status: None   Collection Time: 08/07/17  3:15 PM  Result Value Ref Range Status   Adenovirus NOT DETECTED NOT DETECTED Final   Coronavirus 229E NOT DETECTED NOT DETECTED Final   Coronavirus HKU1 NOT DETECTED NOT DETECTED Final   Coronavirus NL63 NOT DETECTED NOT DETECTED Final   Coronavirus OC43 NOT DETECTED NOT DETECTED Final   Metapneumovirus NOT DETECTED NOT DETECTED Final   Rhinovirus / Enterovirus NOT DETECTED NOT DETECTED Final   Influenza A NOT DETECTED NOT DETECTED Final   Influenza  B NOT DETECTED NOT DETECTED Final   Parainfluenza Virus 1 NOT DETECTED NOT DETECTED Final   Parainfluenza Virus 2 NOT DETECTED NOT DETECTED Final   Parainfluenza Virus 3 NOT DETECTED NOT DETECTED Final   Parainfluenza Virus 4 NOT DETECTED NOT DETECTED Final   Respiratory Syncytial Virus NOT DETECTED NOT DETECTED Final   Bordetella pertussis NOT DETECTED NOT DETECTED Final   Chlamydophila pneumoniae NOT DETECTED NOT DETECTED Final   Mycoplasma pneumoniae NOT DETECTED NOT DETECTED Final    Comment: Performed at [facility], [place] [address], [place], [place] [address]     Medications:   . amLODipine  10 mg Oral Daily  . febuxostat  40 mg Oral Daily  . furosemide  10 mg Oral Daily  . gabapentin  300 mg Oral  Daily  . heparin  5,000 Units Subcutaneous Q8H  . insulin aspart  0-9 Units Subcutaneous TID WC  . linagliptin  5 mg Oral Daily  . lisinopril  10 mg Oral Daily  . magnesium oxide  400 mg Oral Daily  . mouth rinse  15 mL Mouth Rinse BID  . metoprolol  200 mg Oral Daily  . rosuvastatin  5 mg Oral QPM   Continuous Infusions:    LOS: 1 day   Charlynne Cousins  [facility] Pager [phone]  *Please refer to [email or web address], password [email or web address] to get updated schedule on who will round on this patient, as hospitalists switch teams weekly. If 7PM-7AM, please contact night-coverage at [email or web address], password [email or web address] for any overnight needs.  08/08/2017, 9:19 AM

## 2017-08-08 NOTE — Progress Notes (Signed)
PT Cancellation Note  Patient Details Name: Tonya Moon MRN: [id number] DOB: [birth year]   Cancelled Treatment:    Reason Eval/Treat Not Completed: Medical issues which prohibited therapy(awaiting pelvis xray and V/Q scan)   Koltin Wehmeyer,KATHrine E 08/08/2017, 8:44 AM Carmelia Bake, PT, DPT 08/08/2017 Pager: [phone]

## 2017-08-08 NOTE — H&P (Signed)
History and Physical    Tonya Moon IWL:[id number] DOB: 1950/11/30 DOA: 08/07/2017  PCP: [facility] [place] [facility]  Patient coming from: Home.  Chief Complaint: Fall.  HPI: Tonya Moon is a 67 y.o. female with history of breast cancer in remission, diabetes mellitus, hypertension, gout, chronic anemia, chronic kidney disease was brought to the ER after patient had a fall 2 days ago.  Patient had a fall at her house while he was in the bathroom.  Patient states she was going to the bathroom and was sitting on the stool she fell off.  Denies losing consciousness but had to be held by her husband to get up.  Patient was noticed to have some slurred speech and with this concern patient was taken to the ER at [facility] by [facility] but was unable to void because of the carotid.  Since family was concerned about her slurred speech and the fall she was taken to the ER at [facility] yesterday.  ED Course: In the ER patient had CT head which was unremarkable.  Patient was found to be hypoxic and had a CT chest done without contrast which showed only a pulmonary nodule.  Troponin is mildly elevated EKG shows prolonged QT otherwise shows diffuse ST-T changes.  Patient is being admitted for further observation.  On my exam patient denies any chest pain and has no shortness of breath.  Is able to move all extremities.  Patient feels her slurred speech is improved.  Review of Systems: As per HPI, rest all negative.   Past Medical History:  Diagnosis Date  . Cancer (HCC)    breast  . CHF (congestive heart failure) ([place])   . Diabetes mellitus without complication ([place])   . Hypertension     Past Surgical History:  Procedure Laterality Date  . BREAST SURGERY       reports that  has never smoked. she has never used smokeless tobacco. She reports that she does not drink alcohol or use drugs.  No Known Allergies  Family History  Problem Relation Age of  Onset  . Diabetes Mellitus II Brother     Prior to Admission medications   Medication Sig Start Date End Date Taking? Authorizing Provider  Albuterol Sulfate (PROAIR HFA IN) Inhale 1 puff into the lungs every 8 (eight) hours as needed.   Yes [provider]  amLODipine (NORVASC) 10 MG tablet Take 10 mg by mouth daily.   Yes [provider]  Aromatic Inhalants (VICKS VAPOR INHALER IN) Inhale into the lungs as needed.    Yes [provider]  furosemide (LASIX) 20 MG tablet Take 10 mg by mouth daily.    Yes [provider]  gabapentin (NEURONTIN) 300 MG capsule Take 300 mg by mouth daily. 05/18/17  Yes [provider]  lisinopril (PRINIVIL,ZESTRIL) 20 MG tablet Take 10 mg by mouth daily.  07/20/17  Yes [provider]  magnesium oxide (MAG-OX) 400 MG tablet Take 400 mg by mouth daily.   Yes [provider]  metoprolol (TOPROL-XL) 200 MG 24 hr tablet Take 200 mg by mouth daily. Take with or immediately following a meal.    Yes [provider]  rosuvastatin (CRESTOR) 5 MG tablet Take 5 mg by mouth every evening. 07/05/17  Yes [provider]  TRADJENTA 5 MG TABS tablet Take 5 mg by mouth daily. 07/09/17  Yes [provider]  ULORIC 40 MG tablet Take 40 mg by mouth  daily. 07/22/17  Yes [provider]  Vitamin D, Ergocalciferol, (DRISDOL) 50000 units CAPS capsule Take 50,000 Units by mouth every 7 (seven) days.   Yes [provider]  benazepril-hydrochlorthiazide (LOTENSIN HCT) 10-12.5 MG tablet Take 1 tablet by mouth daily.    [provider]  doxycycline (VIBRA-TABS) 100 MG tablet Take 100 mg by mouth 2 (two) times daily. For 7 days 07/10/17   [provider]    Physical Exam: Vitals:   08/07/17 2000 08/07/17 2030 08/07/17 2045 08/07/17 2143  BP: 121/74 110/74  128/86  Pulse: 78 79 80 93  Resp: 18 (!) 25 19 20   Temp:    98.2 F (36.8 C)  TempSrc:    Oral  SpO2: 97% 97%  97% 100%  Weight:    128.2 kg (282 lb 10.1 oz)  Height:    5\' 5"  (1.651 m)      Constitutional: Moderately built and nourished. Vitals:   08/07/17 2000 08/07/17 2030 08/07/17 2045 08/07/17 2143  BP: 121/74 110/74  128/86  Pulse: 78 79 80 93  Resp: 18 (!) 25 19 20   Temp:    98.2 F (36.8 C)  TempSrc:    Oral  SpO2: 97% 97% 97% 100%  Weight:    128.2 kg (282 lb 10.1 oz)  Height:    5\' 5"  (1.651 m)   Eyes: Anicteric no pallor. ENMT: No discharge from the ears eyes nose or mouth. Neck: No mass felt.  No neck rigidity. Respiratory: No rhonchi or crepitations. Cardiovascular: S1-S2 heard no murmurs appreciated. Abdomen: Soft nontender bowel sounds present. Musculoskeletal: No edema.  No joint effusion. Skin: No rash.  Skin appears warm. Neurologic: Alert awake oriented to time place and person.  Moves all extremities 5 x 5.  No facial asymmetry.  Pupils are equal and reacting to light.  Tongue is midline. Psychiatric: Appears normal.  Normal affect.   Labs on Admission: I have personally reviewed following labs and imaging studies  CBC: Recent Labs  Lab 08/07/17 1220  WBC 17.1*  NEUTROABS 12.7*  HGB 11.7*  HCT 35.3*  MCV 91.9  PLT 326   Basic Metabolic Panel: Recent Labs  Lab 08/07/17 1331  NA 136  K 4.0  CL 99*  CO2 28  GLUCOSE 124*  BUN 22*  CREATININE 1.70*  CALCIUM 8.5*   GFR: Estimated Creatinine Clearance: 44.5 mL/min (A) (by C-G formula based on SCr of 1.7 mg/dL (H)). Liver Function Tests: Recent Labs  Lab 08/07/17 1331  AST 35  ALT 16  ALKPHOS 65  BILITOT 0.6  PROT 8.1  ALBUMIN 3.6   No results for input(s): LIPASE, AMYLASE in the last 168 hours. No results for input(s): AMMONIA in the last 168 hours. Coagulation Profile: No results for input(s): INR, PROTIME in the last 168 hours. Cardiac Enzymes: Recent Labs  Lab 08/07/17 1220  TROPONINI 0.05*   BNP (last 3 results) No results for input(s): PROBNP in the last 8760  hours. HbA1C: No results for input(s): HGBA1C in the last 72 hours. CBG: No results for input(s): GLUCAP in the last 168 hours. Lipid Profile: No results for input(s): CHOL, HDL, LDLCALC, TRIG, CHOLHDL, LDLDIRECT in the last 72 hours. Thyroid Function Tests: No results for input(s): TSH, T4TOTAL, FREET4, T3FREE, THYROIDAB in the last 72 hours. Anemia Panel: No results for input(s): VITAMINB12, FOLATE, FERRITIN, TIBC, IRON, RETICCTPCT in the last 72 hours. Urine analysis:    Component Value Date/Time   COLORURINE YELLOW 08/07/2017 1254   APPEARANCEUR  CLEAR 08/07/2017 1254   LABSPEC >1.030 (H) 08/07/2017 1254   PHURINE 5.5 08/07/2017 1254   GLUCOSEU NEGATIVE 08/07/2017 1254   HGBUR TRACE (A) 08/07/2017 1254   BILIRUBINUR SMALL (A) 08/07/2017 1254   KETONESUR NEGATIVE 08/07/2017 1254   PROTEINUR 100 (A) 08/07/2017 1254   NITRITE NEGATIVE 08/07/2017 1254   LEUKOCYTESUR NEGATIVE 08/07/2017 1254   Sepsis Labs: @LABRCNTIP (procalcitonin:4,lacticidven:4) )No results found for this or any previous visit (from the past 240 hour(s)).   Radiological Exams on Admission: Dg Chest 2 View  Result Date: 08/07/2017 CLINICAL DATA:  Recent fall. Chest pain. Cough and chest congestion. EXAM: CHEST - 2 VIEW COMPARISON:  02/15/2015 FINDINGS: The heart size and mediastinal contours are within normal limits. Aortic atherosclerosis. Both lungs are clear. Surgical clips again seen in right axilla. The visualized skeletal structures are unremarkable. IMPRESSION: No active cardiopulmonary disease. Electronically Signed   By: Earle Gell M.D.   On: 08/07/2017 10:44   Ct Head Wo Contrast  Result Date: 08/07/2017 CLINICAL DATA:  Pain following fall.  History of breast carcinoma EXAM: CT HEAD WITHOUT CONTRAST TECHNIQUE: Contiguous axial images were obtained from the base of the skull through the vertex without intravenous contrast. COMPARISON:  August 06, 2017. FINDINGS: Brain: The ventricles are normal in size  and configuration. There is no intracranial mass, hemorrhage, extra-axial fluid collection, or midline shift. Patchy small vessel disease in the centra semiovale bilaterally is stable. There is no new gray-white compartment lesion evident. No acute infarct appreciable. Mild basal ganglia calcification is felt to be physiologic in this age group. Vascular: No hyperdense vessel. There is calcification in each carotid siphon region. Skull: The bony calvarium appears intact. Sinuses/Orbits: There is opacification of most of the left maxillary antrum. There is mucosal thickening to a much lesser extent in the right maxillary antrum. There is opacification of several ethmoid air cells bilaterally. There is mild patchy opacity in the right sphenoid sinus with mucosal thickening in both anterior sphenoid regions. Orbits appear symmetric bilaterally. Other: Mastoid air cells are clear. IMPRESSION: Patchy supratentorial small vessel disease. No acute infarct evident. No mass or hemorrhage. Foci of arteriovascular calcification noted. There are multiple foci paranasal sinus disease, stable from 1 day prior. Electronically Signed   By: Lowella Grip III M.D.   On: 08/07/2017 12:16   Ct Chest Wo Contrast  Result Date: 08/07/2017 CLINICAL DATA:  Shortness of breath today.  Fell yesterday. EXAM: CT CHEST WITHOUT CONTRAST TECHNIQUE: Multidetector CT imaging of the chest was performed following the standard protocol without IV contrast. COMPARISON:  Chest radiograph August 07, 2017 and CT chest February 16, 2015 FINDINGS: CARDIOVASCULAR: Heart size is normal. No pericardial collections. Severe coronary artery calcifications. Thoracic aorta is normal course and caliber, moderate calcific atherosclerosis. Main pulmonary is enlarged at 4.5 cm seen with chronic pulmonary arterial hypertension. Pulmonary embolism not excluded by noncontrast CT. MEDIASTINUM/NODES: No mediastinal mass. No lymphadenopathy by CT size criteria though  sensitivity decreased without intravenous contrast. Similar subcentimeter mediastinal lymph nodes. Normal appearance of thoracic esophagus though not tailored for evaluation. LUNGS/PLEURA: Tracheobronchial tree is patent, no pneumothorax. No pleural effusions, focal consolidations, or masses. Mild bronchiectasis versus respiratory motion artifact LEFT lower lobe. Lingular and LEFT lower lobe atelectasis/scarring. New solid irregular 9 mm and adjacent sub solid 4 mm pulmonary nodules RIGHT upper lobe (series 4, image 55/141). Stable appearance of 4 mm LEFT upper lobe sub solid pulmonary nodule. UPPER ABDOMEN: Nonacute.  Very small hiatal hernia. MUSCULOSKELETAL: Nonacute. Spiculated 12 x 23  mm RIGHT anterior chest wall mass was 13 x 32 mm with surrounding calcifications versus surgical clips, possible seroma. Surgical clips RIGHT axilla. IMPRESSION: 1. No acute cardiopulmonary process or CT findings of thoracic trauma. 2. **An incidental finding of potential clinical significance has been found. New RIGHT upper lobe pulmonary nodules measuring to 9 mm. Given history of RIGHT breast cancer, metastatic disease not excluded. Recommend follow-up. Aortic Atherosclerosis (ICD10-I70.0). Electronically Signed   By: Elon Alas M.D.   On: 08/07/2017 15:14    EKG: Independently reviewed.  Normal sinus rhythm with diffuse T wave changes and prolonged QTC 514 ms.  Assessment/Plan Principal Problem:   Acute respiratory failure with hypoxia (HCC) Active Problems:   Type 2 diabetes mellitus with renal manifestations (HCC)   CKD (chronic kidney disease) stage 3, GFR 30-59 ml/min (HCC)   Essential hypertension   Normocytic normochromic anemia    1. Acute hypoxic respiratory failure -cause not clear.  Will check VQ scan.  Check 2D echo since patient's troponin is elevated.  Patient does not have any fever or chills or any wheezing on my exam.  We will continue to monitor. 2. Slurred speech with fall -check MRI of  the brain.  Check x-ray of the pelvis.  Physical therapy consult. 3. Pulmonary nodule on CT scan of the chest -further workup as outpatient. 4. Diabetes mellitus type 2 on Tradjenta which will be continued with sliding scale coverage. 5. Hypertension on ACE inhibitor beta-blocker and amlodipine. 6. Chronic kidney disease stage III creatinine appears to be at baseline when compared to the labs in care everywhere. 7. Chronic normocytic normochromic anemia likely from chronic disease.  Follow CBC. 8. History of gout on [place]. 9. Prolonged QT -follow metabolic panel and magnesium closely.  Avoid QT prolonging medications. 10. Hyperlipidemia on statins. 11. Obesity.   DVT prophylaxis: Lovenox. Code Status: Full code. Family Communication: Discussed with patient. Disposition Plan: Home. Consults called: None. Admission status: Observation.   Rise Patience MD [facility] Pager [phone].  If 7PM-7AM, please contact night-coverage [email or web address] Password [facility]  08/08/2017, 3:17 AM

## 2017-08-08 NOTE — Progress Notes (Signed)
Call received from MRI representative regarding pts MRI. Test will need to be completed at the [place].

## 2017-08-08 NOTE — Progress Notes (Signed)
Patient returned from Pulmonary ventilation scan. Per radiology technician the pt refused to have test completed due to dye being used. Discussed the importance of test for pt. Pt refused. Will update provider.

## 2017-08-08 NOTE — Progress Notes (Signed)
Bilateral lower extremity venous duplex completed. Technically limited due to body habitus and respiratory interference ( snoring ). All segments of the veins could not be visualized. There is no obvious evidence of DVT or superficial thrombosis of the areas able to be imaged. Inconclusive study due to limitations mentioned. [facility], [place] 08/08/2017, 3:42 PM

## 2017-08-08 NOTE — Progress Notes (Signed)
CRITICAL VALUE ALERT  Critical Value:  Troponin 0.05  Date & Time Notied: 08/08/17 0375  Provider Notified: Hilbert Bible, NP  Orders Received/Actions taken:

## 2017-08-09 LAB — GLUCOSE, CAPILLARY
GLUCOSE-CAPILLARY: 120 mg/dL — AB (ref 65–99)
GLUCOSE-CAPILLARY: 111 mg/dL — AB (ref 65–99)
Glucose-Capillary: 138 mg/dL — ABNORMAL HIGH (ref 65–99)
Glucose-Capillary: 134 mg/dL — ABNORMAL HIGH (ref 65–99)

## 2017-08-09 MED ORDER — AMOXICILLIN-POT CLAVULANATE 875-125 MG PO TABS
1.0000 | ORAL_TABLET | Freq: Two times a day (BID) | ORAL | Status: DC
  Administered 2017-08-09 (×2): 1 via ORAL
  Filled 2017-08-09 (×2): qty 1

## 2017-08-09 NOTE — Progress Notes (Signed)
Pt O2 saturation was found to be 81% on RA after ambulating to the [facility]. Pt placed on 2L oxygen via [place] and  oxygen saturation is currently 97%.

## 2017-08-09 NOTE — Progress Notes (Signed)
[facility] PROGRESS NOTE    Progress Note  Tonya Moon  GGY:[id number] DOB: 1950/07/22 DOA: 08/07/2017 [facility]CP: [facility]     Brief Narrative:   Tonya Moon is an 67 y.o. female past medical history of breast cancer on remission, chronic kidney disease stage III, diabetes mellitus type 2, who was brought to the ED after a fall that happened 2 days prior to admission, the patient denies any loss of consciousness she was held by her husband.  But her husband relate there was some slurred speech him a CT head scan of the head was unremarkable  Assessment/Plan:   Acute respiratory failure with hypoxia ([place]) VQ scan is pending as patient refused,  Lower extremity Doppler to rule out a DVT. He has been on oxygen in the hospital and has been greater than 97% we will discontinue oxygen. Is a single episode of 88% on room air we will discontinue oxygen. 2D echo showed an ejection fraction of 03% with diastolic dysfunction   New fever possibly due to sinusitis/left molar infection: Start her on oral amoxicillin.  MRI did not see any abscess of her mouth.  Possible slurred speech: MRI of the brain showed no acute findings Physical therapy consult recommended home health PT.  Type 2 diabetes mellitus with renal manifestations ([place]) Well controlled, change in current management.  Pulmonary nodule on CT scan: Chest will need further workup as an outpatient  CKD (chronic kidney disease) stage 3, GFR 30-59 ml/min (HCC) Has remained at baseline.  Essential hypertension Continue ACE inhibitor, beta-blocker and amlodipine. Blood pressure has remained stable.  Normocytic normochromic anemia  DVT prophylaxis: lovenox Family Communication:daughter Disposition Plan/Barrier to D/C: home in am Code Status:     Code Status Orders  (From admission, onward)        Start     Ordered   08/08/17 0313  Full code  Continuous     08/08/17 0316    Code Status History     Date Active Date Inactive Code Status Order ID Comments User Context   This patient has a current code status but no historical code status.        IV Access:    Peripheral IV   Procedures and diagnostic studies:   Dg Chest 2 View  Result Date: 08/07/2017 CLINICAL DATA:  Recent fall. Chest pain. Cough and chest congestion. EXAM: CHEST - 2 VIEW COMPARISON:  02/15/2015 FINDINGS: The heart size and mediastinal contours are within normal limits. Aortic atherosclerosis. Both lungs are clear. Surgical clips again seen in right axilla. The visualized skeletal structures are unremarkable. IMPRESSION: No active cardiopulmonary disease. Electronically Signed   By: Earle Gell M.D.   On: 08/07/2017 10:44   Ct Head Wo Contrast  Result Date: 08/07/2017 CLINICAL DATA:  Pain following fall.  History of breast carcinoma EXAM: CT HEAD WITHOUT CONTRAST TECHNIQUE: Contiguous axial images were obtained from the base of the skull through the vertex without intravenous contrast. COMPARISON:  August 06, 2017. FINDINGS: Brain: The ventricles are normal in size and configuration. There is no intracranial mass, hemorrhage, extra-axial fluid collection, or midline shift. Patchy small vessel disease in the centra semiovale bilaterally is stable. There is no new gray-white compartment lesion evident. No acute infarct appreciable. Mild basal ganglia calcification is felt to be physiologic in this age group. Vascular: No hyperdense vessel. There is calcification in each carotid siphon region. Skull: The bony calvarium appears intact. Sinuses/Orbits: There is opacification of most of the left maxillary antrum.  There is mucosal thickening to a much lesser extent in the right maxillary antrum. There is opacification of several ethmoid air cells bilaterally. There is mild patchy opacity in the right sphenoid sinus with mucosal thickening in both anterior sphenoid regions. Orbits appear symmetric bilaterally. Other: Mastoid air  cells are clear. IMPRESSION: Patchy supratentorial small vessel disease. No acute infarct evident. No mass or hemorrhage. Foci of arteriovascular calcification noted. There are multiple foci paranasal sinus disease, stable from 1 day prior. Electronically Signed   By: Lowella Grip III M.D.   On: 08/07/2017 12:16   Ct Chest Wo Contrast  Result Date: 08/07/2017 CLINICAL DATA:  Shortness of breath today.  Fell yesterday. EXAM: CT CHEST WITHOUT CONTRAST TECHNIQUE: Multidetector CT imaging of the chest was performed following the standard protocol without IV contrast. COMPARISON:  Chest radiograph August 07, 2017 and CT chest February 16, 2015 FINDINGS: CARDIOVASCULAR: Heart size is normal. No pericardial collections. Severe coronary artery calcifications. Thoracic aorta is normal course and caliber, moderate calcific atherosclerosis. Main pulmonary is enlarged at 4.5 cm seen with chronic pulmonary arterial hypertension. Pulmonary embolism not excluded by noncontrast CT. MEDIASTINUM/NODES: No mediastinal mass. No lymphadenopathy by CT size criteria though sensitivity decreased without intravenous contrast. Similar subcentimeter mediastinal lymph nodes. Normal appearance of thoracic esophagus though not tailored for evaluation. LUNGS/PLEURA: Tracheobronchial tree is patent, no pneumothorax. No pleural effusions, focal consolidations, or masses. Mild bronchiectasis versus respiratory motion artifact LEFT lower lobe. Lingular and LEFT lower lobe atelectasis/scarring. New solid irregular 9 mm and adjacent sub solid 4 mm pulmonary nodules RIGHT upper lobe (series 4, image 55/141). Stable appearance of 4 mm LEFT upper lobe sub solid pulmonary nodule. UPPER ABDOMEN: Nonacute.  Very small hiatal hernia. MUSCULOSKELETAL: Nonacute. Spiculated 12 x 23 mm RIGHT anterior chest wall mass was 13 x 32 mm with surrounding calcifications versus surgical clips, possible seroma. Surgical clips RIGHT axilla. IMPRESSION: 1. No acute  cardiopulmonary process or CT findings of thoracic trauma. 2. **An incidental finding of potential clinical significance has been found. New RIGHT upper lobe pulmonary nodules measuring to 9 mm. Given history of RIGHT breast cancer, metastatic disease not excluded. Recommend follow-up. Aortic Atherosclerosis (ICD10-I70.0). Electronically Signed   By: Elon Alas M.D.   On: 08/07/2017 15:14   Mr Brain Wo Contrast  Result Date: 08/09/2017 CLINICAL DATA:  67 year old female status post fall in bathroom. Slurred speech and confusion. EXAM: MRI HEAD WITHOUT CONTRAST TECHNIQUE: Multiplanar, multiecho pulse sequences of the brain and surrounding structures were obtained without intravenous contrast. COMPARISON:  Head CT without contrast 08/07/2017, and earlier. FINDINGS: Brain: No restricted diffusion or evidence of acute infarction. Partially empty sella. No midline shift, mass effect, evidence of mass lesion, ventriculomegaly, extra-axial collection or acute intracranial hemorrhage. Cervicomedullary junction and pituitary are within normal limits. Patchy and confluent bilateral cerebral white matter T2 and FLAIR hyperintensity in a nonspecific configuration, moderate for age. There are several chronic micro hemorrhages scattered in the brain, including the left corona radiata (series 9, image 62), right thalamus, right posterior temporal lobe. No cortical encephalomalacia identified. T2 heterogeneity in the bilateral deep gray matter nuclei appears largely related to perivascular spaces. The brainstem and cerebellum appear normal. Vascular: Major intracranial vascular flow voids are preserved. Skull and upper cervical spine: Nonspecific mildly decreased T1 marrow signal throughout the skull and visible cervical spine. No Upper cervical spinal stenosis. Sinuses/Orbits: Bilateral orbits soft tissues appear normal. Widespread mild to moderate paranasal sinus mucosal thickening, with a small fluid level in the  right maxillary sinus. There is diffuse nasal cavity mucosal thickening with a small volume of retained secretions. Furthermore, there is increased soft tissue in the nasopharynx. There appears to be maintained normal adenoid architecture (series 6, image 4). The parapharyngeal spaces appear to remain normal. There is similar mild enlargement of the soft palate. Other: Bilateral mastoid air cells and tympanic cavities remain clear. Visible internal auditory structures appear normal. Visible face and scalp soft tissues are within normal limits. IMPRESSION: 1.  No acute intracranial abnormality. Moderate for age white matter signal changes with scattered chronic micro-hemorrhages in the bare brain most compatible with chronic small vessel disease. 2. Acute Rhinosinusitis. Soft tissue hypertrophy in the nasopharynx most compatible with benign inflammation of the adenoids. Small volume retained secretions in the nasal cavity and nasopharynx. Electronically Signed   By: Genevie Ann M.D.   On: 08/09/2017 07:36   Dg Pelvis Portable  Result Date: 08/08/2017 CLINICAL DATA:  Recent fall. EXAM: PORTABLE PELVIS 1-2 VIEWS COMPARISON:  None. FINDINGS: No acute or healing fractures are present. The hips project over the acetabula bilaterally. Degenerative changes are noted at the SI joints. IMPRESSION: Negative. Electronically Signed   By: San Morelle M.D.   On: 08/08/2017 10:30     Medical Consultants:    None.  Anti-Infectives:   None  Subjective:    [name] patient at baseline ever able to have a conversation, she refused a VQ scan yesterday.  Objective:    Vitals:   08/08/17 1355 08/08/17 2056 08/09/17 0500 08/09/17 0501  BP: 109/65 (!) 92/59  112/70  Pulse: 79 81  96  Resp:  18  20  Temp: 98.9 F (37.2 C) 98.5 F (36.9 C)  (!) 100.6 F (38.1 C)  TempSrc: Oral Oral  Oral  SpO2: 97% 95%  96%  Weight:   128 kg (282 lb 3 oz)   Height:        Intake/Output Summary (Last 24  hours) at 08/09/2017 0850 Last data filed at 08/09/2017 0300 Gross per 24 hour  Intake 60 ml  Output 250 ml  Net -190 ml   Filed Weights   08/07/17 2143 08/08/17 0500 08/09/17 0500  Weight: 128.2 kg (282 lb 10.1 oz) 127.7 kg (281 lb 8.4 oz) 128 kg (282 lb 3 oz)    Exam: General exam: In no acute distress, poor oral hygiene she is tender along the left maxilla and left sinus Respiratory system: Good air movement and clear to auscultation. Cardiovascular system: S1 & S2 heard, RRR. No JVD.  Gastrointestinal system: Abdomen is nondistended, soft and nontender.  Central nervous system: Awake alert and oriented x3 nonfocal on physical exam Extremities: No pedal edema. Skin: Dry skin.   Data Reviewed:    Labs: Basic Metabolic Panel: Recent Labs  Lab 08/07/17 1331 08/08/17 0517  NA 136 140  K 4.0 4.0  CL 99* 102  CO2 28 27  GLUCOSE 124* 142*  BUN 22* 25*  CREATININE 1.70* 1.76*  CALCIUM 8.5* 8.7*  MG  --  1.8   GFR Estimated Creatinine Clearance: 43 mL/min (A) (by C-G formula based on SCr of 1.76 mg/dL (H)). Liver Function Tests: Recent Labs  Lab 08/07/17 1331  AST 35  ALT 16  ALKPHOS 65  BILITOT 0.6  PROT 8.1  ALBUMIN 3.6   No results for input(s): LIPASE, AMYLASE in the last 168 hours. No results for input(s): AMMONIA in the last 168 hours. Coagulation profile No results for input(s): INR, PROTIME in the  last 168 hours.  CBC: Recent Labs  Lab 08/07/17 1220 08/08/17 0517  WBC 17.1* 12.8*  NEUTROABS 12.7*  --   HGB 11.7* 11.1*  HCT 35.3* 35.1*  MCV 91.9 95.4  PLT 170 189   Cardiac Enzymes: Recent Labs  Lab 08/07/17 1220 08/08/17 0517 08/08/17 0953 08/08/17 1559  TROPONINI 0.05* 0.05* 0.03* 0.03*   BNP (last 3 results) No results for input(s): PROBNP in the last 8760 hours. CBG: Recent Labs  Lab 08/08/17 0729 08/08/17 1146 08/08/17 1615 08/08/17 2016 08/09/17 0814  GLUCAP 124* 107* 136* 94 120*   D-Dimer: No results for input(s):  DDIMER in the last 72 hours. Hgb A1c: No results for input(s): HGBA1C in the last 72 hours. Lipid Profile: No results for input(s): CHOL, HDL, LDLCALC, TRIG, CHOLHDL, LDLDIRECT in the last 72 hours. Thyroid function studies: Recent Labs    08/08/17 0517  TSH 2.260   Anemia work up: No results for input(s): VITAMINB12, FOLATE, FERRITIN, TIBC, IRON, RETICCTPCT in the last 72 hours. Sepsis Labs: Recent Labs  Lab 08/07/17 1220 08/08/17 0517  WBC 17.1* 12.8*   Microbiology Recent Results (from the past 240 hour(s))  Urine culture     Status: None   Collection Time: 08/07/17 12:54 PM  Result Value Ref Range Status   Specimen Description   Final    URINE, CATHETERIZED Performed at [facility], [address] [place], [place] [address]    Special Requests   Final    Normal Performed at [facility], [address] [place], [place] [address]    Culture   Final    NO GROWTH Performed at [facility], [place] [address], [place], [place] [address]    Report Status 08/08/2017 FINAL  Final  Respiratory Panel by PCR     Status: None   Collection Time: 08/07/17  3:15 PM  Result Value Ref Range Status   Adenovirus NOT DETECTED NOT DETECTED Final   Coronavirus 229E NOT DETECTED NOT DETECTED Final   Coronavirus HKU1 NOT DETECTED NOT DETECTED Final   Coronavirus NL63 NOT DETECTED NOT DETECTED Final   Coronavirus OC43 NOT DETECTED NOT DETECTED Final   Metapneumovirus NOT DETECTED NOT DETECTED Final   Rhinovirus / Enterovirus NOT DETECTED NOT DETECTED Final   Influenza A NOT DETECTED NOT DETECTED Final   Influenza B NOT DETECTED NOT DETECTED Final   Parainfluenza Virus 1 NOT DETECTED NOT DETECTED Final   Parainfluenza Virus 2 NOT DETECTED NOT DETECTED Final   Parainfluenza Virus 3 NOT DETECTED NOT DETECTED Final   Parainfluenza Virus 4 NOT DETECTED NOT DETECTED Final   Respiratory Syncytial Virus NOT DETECTED NOT DETECTED Final   Bordetella pertussis NOT  DETECTED NOT DETECTED Final   Chlamydophila pneumoniae NOT DETECTED NOT DETECTED Final   Mycoplasma pneumoniae NOT DETECTED NOT DETECTED Final    Comment: Performed at [facility], [place] [address], [place], [place] [address]     Medications:   . amLODipine  10 mg Oral Daily  . febuxostat  40 mg Oral Daily  . furosemide  10 mg Oral Daily  . gabapentin  300 mg Oral Daily  . heparin  5,000 Units Subcutaneous Q8H  . insulin aspart  0-9 Units Subcutaneous TID WC  . linagliptin  5 mg Oral Daily  . lisinopril  10 mg Oral Daily  . magnesium oxide  400 mg Oral Daily  . mouth rinse  15 mL Mouth Rinse BID  .  metoprolol  200 mg Oral Daily  . rosuvastatin  5 mg Oral QPM   Continuous Infusions:    LOS: 2 days   Charlynne Cousins  [facility] Pager [phone]  *Please refer to [email or web address], password [email or web address] to get updated schedule on who will round on this patient, as hospitalists switch teams weekly. If 7PM-7AM, please contact night-coverage at [email or web address], password [email or web address] for any overnight needs.  08/09/2017, 8:50 AM

## 2017-08-10 ENCOUNTER — Inpatient Hospital Stay (HOSPITAL_COMMUNITY): Payer: Medicare Other

## 2017-08-10 ENCOUNTER — Encounter (HOSPITAL_COMMUNITY): Payer: Self-pay | Admitting: Radiology

## 2017-08-10 LAB — GLUCOSE, CAPILLARY
GLUCOSE-CAPILLARY: 130 mg/dL — AB (ref 65–99)
GLUCOSE-CAPILLARY: 129 mg/dL — AB (ref 65–99)
Glucose-Capillary: 139 mg/dL — ABNORMAL HIGH (ref 65–99)
Glucose-Capillary: 145 mg/dL — ABNORMAL HIGH (ref 65–99)

## 2017-08-10 MED ORDER — TECHNETIUM TC 99M DIETHYLENETRIAME-PENTAACETIC ACID
31.9000 | Freq: Once | INTRAVENOUS | Status: AC | PRN
  Administered 2017-08-10: 31.9 via INTRAVENOUS

## 2017-08-10 MED ORDER — SODIUM CHLORIDE 0.9 % IV SOLN
3.0000 g | Freq: Four times a day (QID) | INTRAVENOUS | Status: DC
  Administered 2017-08-10 – 2017-08-13 (×12): 3 g via INTRAVENOUS
  Filled 2017-08-10 (×14): qty 3

## 2017-08-10 MED ORDER — VANCOMYCIN HCL 10 G IV SOLR: 1500.0000 mg | INTRAVENOUS | Status: DC

## 2017-08-10 MED ORDER — VANCOMYCIN HCL 10 G IV SOLR
1500.0000 mg | INTRAVENOUS | Status: DC
  Administered 2017-08-12: 1500 mg via INTRAVENOUS
  Filled 2017-08-10: qty 1500

## 2017-08-10 MED ORDER — TECHNETIUM TO 99M ALBUMIN AGGREGATED
4.0000 | Freq: Once | INTRAVENOUS | Status: AC | PRN
  Administered 2017-08-10: 4 via INTRAVENOUS

## 2017-08-10 MED ORDER — SODIUM CHLORIDE 0.9 % IV SOLN
1.5000 g | Freq: Four times a day (QID) | INTRAVENOUS | Status: DC
  Filled 2017-08-10: qty 1.5

## 2017-08-10 MED ORDER — VANCOMYCIN HCL 10 G IV SOLR
2500.0000 mg | Freq: Once | INTRAVENOUS | Status: AC
  Administered 2017-08-10: 2500 mg via INTRAVENOUS
  Filled 2017-08-10: qty 2000

## 2017-08-10 MED ORDER — SODIUM CHLORIDE 0.9 % IV SOLN: 3.0000 g | Freq: Four times a day (QID) | INTRAVENOUS | Status: DC

## 2017-08-10 NOTE — Progress Notes (Signed)
Physical Therapy Treatment Patient Details Name: Tonya Moon MRN: [id number] DOB: 1950/09/02 Today's Date: 08/10/2017    History of Present Illness 67 y.o. female past medical history of breast cancer on remission, chronic kidney disease stage III, diabetes mellitus type 2, CHF and admitted with slurred speech and Acute respiratory failure with hypoxia     PT Comments    Pt is progressing, incr gait distance today, however she is with limited motivation and only participates  With encouragement from daughter; will continue efforts  Follow Up Recommendations  Home health PT     Equipment Recommendations  Cane    Recommendations for Other Services       Precautions / Restrictions Precautions Precautions: Fall Restrictions Weight Bearing Restrictions: No    Mobility  Bed Mobility Overal bed mobility: Needs Assistance Bed Mobility: Supine to Sit;Sit to Supine     Supine to sit: Min assist(HOB at 30*) Sit to supine: Min assist   General bed mobility comments: pt dtr assist her to sitting EOB, husband assisted with LEs onto bed; HOB at ~30* and pt expressed dislike for this even though she does sleep in a bed at home; encouraged pt to perform as if at home  Transfers Overall transfer level: Needs assistance Equipment used: Straight cane Transfers: Sit to/from Stand Sit to Stand: Min guard         General transfer comment: min/guard for safety; repeated transfer x 3 for 2nd gown removal and positioning  Ambulation/Gait Ambulation/Gait assistance: Min guard;Supervision Ambulation Distance (Feet): 160 Feet Assistive device: Straight cane Gait Pattern/deviations: Step-through pattern;Decreased stride length;Drifts right/left     General Gait Details: pt ambulated in hallway and slow mildly unsteady gait however no overt LOB, pt also used her SPC from home which needs new rubber bottom, SpO2 81% on room air during amb; O2 reapplied and SpO2= 97% with 45seconds, pt  denies DOE and does not comply with instructions for efficient breathing   Stairs            Wheelchair Mobility    Modified Rankin (Stroke Patients Only)       Balance Overall balance assessment: History of Falls                                          Cognition Arousal/Alertness: Awake/alert Behavior During Therapy: WFL for tasks assessed/performed Overall Cognitive Status: Within Functional Limits for tasks assessed                                 General Comments: slow to respond at times however responses appropriate      Exercises      General Comments        Pertinent Vitals/Pain      Home Living                      Prior Function            PT Goals (current goals can now be found in the care plan section) Acute Rehab PT Goals Patient Stated Goal: none PT Goal Formulation: With patient Time For Goal Achievement: 08/15/17 Potential to Achieve Goals: Good Progress towards PT goals: Progressing toward goals    Frequency    Min 3X/week      PT Plan Current plan remains appropriate  Co-evaluation              AM-PAC PT "6 Clicks" Daily Activity  Outcome Measure  Difficulty turning over in bed (including adjusting bedclothes, sheets and blankets)?: A Little Difficulty moving from lying on back to sitting on the side of the bed? : Unable Difficulty sitting down on and standing up from a chair with arms (e.g., wheelchair, bedside commode, etc,.)?: A Little Help needed moving to and from a bed to chair (including a wheelchair)?: A Little Help needed walking in hospital room?: A Little Help needed climbing 3-5 steps with a railing? : A Little 6 Click Score: 16    End of Session Equipment Utilized During Treatment: Gait belt Activity Tolerance: Patient tolerated treatment well Patient left: in bed;with call bell/phone within reach;with family/visitor present Nurse Communication: Mobility  status PT Visit Diagnosis: Difficulty in walking, not elsewhere classified (R26.2)     Time: 2947-6546 PT Time Calculation (min) (ACUTE ONLY): 14 min  Charges:  $Gait Training: 8-22 mins                    G CodesKenyon Ana, PT Pager: [phone] 08/10/2017    Kenyon Ana 08/10/2017, 12:12 PM

## 2017-08-10 NOTE — Progress Notes (Signed)
[facility] PROGRESS NOTE    Progress Note  Tonya Moon  OVZ:[id number] DOB: May 16, 1951 DOA: 08/07/2017 PCP: [facility]     Brief Narrative:   Tonya Moon is an 67 y.o. female past medical history of breast cancer on remission, chronic kidney disease stage III, diabetes mellitus type 2, who was brought to the ED after a fall that happened 2 days prior to admission, the patient denies any loss of consciousness she was held by her husband.  But her husband relate there was some slurred speech him a CT head scan of the head was unremarkable  Assessment/Plan:   Acute respiratory failure with hypoxia ([place]) VQ scan is pending as patient refused, but she says she will try today. Lower extremity Doppler negative for DVT. He has been on oxygen in the hospital and has been greater than 97% we will discontinue oxygen. Is a single episode of 88% on room air we will discontinue oxygen. 2D echo showed an ejection fraction of 41% with diastolic dysfunction   New onset fever Started on IV Unasyn get an CT scan of maxillofacial no abscess. She had a CT scan of the chest on admission that showed lymphadenopathy which could be the source of infection. Check a swallowing evaluation. Her antibiotics to IV Vanco and Unasyn.  Possible slurred speech: MRI of the brain showed no acute findings Physical therapy consult recommended home health PT.  Type 2 diabetes mellitus with renal manifestations ([place]) Well controlled, change in current management.  Pulmonary nodule on CT scan: Chest will need further workup as an outpatient  CKD (chronic kidney disease) stage 3, GFR 30-59 ml/min (HCC) Has remained at baseline.  Essential hypertension Continue ACE inhibitor, beta-blocker and amlodipine. Blood pressure has remained stable.  Normocytic normochromic anemia Follow-up with PCP as an outpatient.  DVT prophylaxis: lovenox Family Communication:daughter Disposition  Plan/Barrier to D/C: home 2-3 days once culture data is back.  Code Status:     Code Status Orders  (From admission, onward)        Start     Ordered   08/08/17 0313  Full code  Continuous     08/08/17 0316    Code Status History    Date Active Date Inactive Code Status Order ID Comments User Context   This patient has a current code status but no historical code status.        IV Access:    Peripheral IV   Procedures and diagnostic studies:   Mr Brain Wo Contrast  Result Date: 08/09/2017 CLINICAL DATA:  68 year old female status post fall in bathroom. Slurred speech and confusion. EXAM: MRI HEAD WITHOUT CONTRAST TECHNIQUE: Multiplanar, multiecho pulse sequences of the brain and surrounding structures were obtained without intravenous contrast. COMPARISON:  Head CT without contrast 08/07/2017, and earlier. FINDINGS: Brain: No restricted diffusion or evidence of acute infarction. Partially empty sella. No midline shift, mass effect, evidence of mass lesion, ventriculomegaly, extra-axial collection or acute intracranial hemorrhage. Cervicomedullary junction and pituitary are within normal limits. Patchy and confluent bilateral cerebral white matter T2 and FLAIR hyperintensity in a nonspecific configuration, moderate for age. There are several chronic micro hemorrhages scattered in the brain, including the left corona radiata (series 9, image 62), right thalamus, right posterior temporal lobe. No cortical encephalomalacia identified. T2 heterogeneity in the bilateral deep gray matter nuclei appears largely related to perivascular spaces. The brainstem and cerebellum appear normal. Vascular: Major intracranial vascular flow voids are preserved. Skull and upper cervical spine: Nonspecific mildly decreased  T1 marrow signal throughout the skull and visible cervical spine. No Upper cervical spinal stenosis. Sinuses/Orbits: Bilateral orbits soft tissues appear normal. Widespread mild to  moderate paranasal sinus mucosal thickening, with a small fluid level in the right maxillary sinus. There is diffuse nasal cavity mucosal thickening with a small volume of retained secretions. Furthermore, there is increased soft tissue in the nasopharynx. There appears to be maintained normal adenoid architecture (series 6, image 4). The parapharyngeal spaces appear to remain normal. There is similar mild enlargement of the soft palate. Other: Bilateral mastoid air cells and tympanic cavities remain clear. Visible internal auditory structures appear normal. Visible face and scalp soft tissues are within normal limits. IMPRESSION: 1.  No acute intracranial abnormality. Moderate for age white matter signal changes with scattered chronic micro-hemorrhages in the bare brain most compatible with chronic small vessel disease. 2. Acute Rhinosinusitis. Soft tissue hypertrophy in the nasopharynx most compatible with benign inflammation of the adenoids. Small volume retained secretions in the nasal cavity and nasopharynx. Electronically Signed   By: Genevie Ann M.D.   On: 08/09/2017 07:36   Dg Pelvis Portable  Result Date: 08/08/2017 CLINICAL DATA:  Recent fall. EXAM: PORTABLE PELVIS 1-2 VIEWS COMPARISON:  None. FINDINGS: No acute or healing fractures are present. The hips project over the acetabula bilaterally. Degenerative changes are noted at the SI joints. IMPRESSION: Negative. Electronically Signed   By: San Morelle M.D.   On: 08/08/2017 10:30     Medical Consultants:    None.  Anti-Infectives:   None  Subjective:    Tonya Moon as per family patient is at baseline able to have conversation  Objective:    Vitals:   08/09/17 2149 08/10/17 0431 08/10/17 0500 08/10/17 0647  BP: 123/72 127/76    Pulse: 85 (!) 102    Resp: 16 16    Temp: 98.7 F (37.1 C) (!) 102.9 F (39.4 C)  100.2 F (37.9 C)  TempSrc: Oral Oral    SpO2: 92% 98%    Weight:   128.5 kg (283 lb 4.7 oz)   Height:         Intake/Output Summary (Last 24 hours) at 08/10/2017 0848 Last data filed at 08/10/2017 0200 Gross per 24 hour  Intake 240 ml  Output 300 ml  Net -60 ml   Filed Weights   08/08/17 0500 08/09/17 0500 08/10/17 0500  Weight: 127.7 kg (281 lb 8.4 oz) 128 kg (282 lb 3 oz) 128.5 kg (283 lb 4.7 oz)    Exam: General exam: In no acute distress, poor oral hygiene she is tender along the left maxilla and left sinus Respiratory system: Good air movement and clear to auscultation. Cardiovascular system: S1 & S2 heard, RRR. No JVD.  Gastrointestinal system: Abdomen is nondistended, soft and nontender.  Central nervous system: Awake alert and oriented x3 nonfocal on physical exam Extremities: No pedal edema. Skin: Dry skin.   Data Reviewed:    Labs: Basic Metabolic Panel: Recent Labs  Lab 08/07/17 1331 08/08/17 0517  NA 136 140  K 4.0 4.0  CL 99* 102  CO2 28 27  GLUCOSE 124* 142*  BUN 22* 25*  CREATININE 1.70* 1.76*  CALCIUM 8.5* 8.7*  MG  --  1.8   GFR Estimated Creatinine Clearance: 43.1 mL/min (A) (by C-G formula based on SCr of 1.76 mg/dL (H)). Liver Function Tests: Recent Labs  Lab 08/07/17 1331  AST 35  ALT 16  ALKPHOS 65  BILITOT 0.6  PROT 8.1  ALBUMIN  3.6   No results for input(s): LIPASE, AMYLASE in the last 168 hours. No results for input(s): AMMONIA in the last 168 hours. Coagulation profile No results for input(s): INR, PROTIME in the last 168 hours.  CBC: Recent Labs  Lab 08/07/17 1220 08/08/17 0517  WBC 17.1* 12.8*  NEUTROABS 12.7*  --   HGB 11.7* 11.1*  HCT 35.3* 35.1*  MCV 91.9 95.4  PLT 170 189   Cardiac Enzymes: Recent Labs  Lab 08/07/17 1220 08/08/17 0517 08/08/17 0953 08/08/17 1559  TROPONINI 0.05* 0.05* 0.03* 0.03*   BNP (last 3 results) No results for input(s): PROBNP in the last 8760 hours. CBG: Recent Labs  Lab 08/09/17 0814 08/09/17 1130 08/09/17 1706 08/09/17 2148 08/10/17 0723  GLUCAP 120* 138* 134* 111* 130*     D-Dimer: No results for input(s): DDIMER in the last 72 hours. Hgb A1c: No results for input(s): HGBA1C in the last 72 hours. Lipid Profile: No results for input(s): CHOL, HDL, LDLCALC, TRIG, CHOLHDL, LDLDIRECT in the last 72 hours. Thyroid function studies: Recent Labs    08/08/17 0517  TSH 2.260   Anemia work up: No results for input(s): VITAMINB12, FOLATE, FERRITIN, TIBC, IRON, RETICCTPCT in the last 72 hours. Sepsis Labs: Recent Labs  Lab 08/07/17 1220 08/08/17 0517  WBC 17.1* 12.8*   Microbiology Recent Results (from the past 240 hour(s))  Urine culture     Status: None   Collection Time: 08/07/17 12:54 PM  Result Value Ref Range Status   Specimen Description   Final    URINE, CATHETERIZED Performed at [facility], [address] [place], [place] [address]    Special Requests   Final    Normal Performed at [facility], [address] [place], [place] [address]    Culture   Final    NO GROWTH Performed at [facility], [place] [address] [place], [place] [address]    Report Status 08/08/2017 FINAL  Final  Respiratory Panel by PCR     Status: None   Collection Time: 08/07/17  3:15 PM  Result Value Ref Range Status   Adenovirus NOT DETECTED NOT DETECTED Final   Coronavirus 229E NOT DETECTED NOT DETECTED Final   Coronavirus HKU1 NOT DETECTED NOT DETECTED Final   Coronavirus NL63 NOT DETECTED NOT DETECTED Final   Coronavirus OC43 NOT DETECTED NOT DETECTED Final   Metapneumovirus NOT DETECTED NOT DETECTED Final   Rhinovirus / Enterovirus NOT DETECTED NOT DETECTED Final   Influenza A NOT DETECTED NOT DETECTED Final   Influenza B NOT DETECTED NOT DETECTED Final   Parainfluenza Virus 1 NOT DETECTED NOT DETECTED Final   Parainfluenza Virus 2 NOT DETECTED NOT DETECTED Final   Parainfluenza Virus 3 NOT DETECTED NOT DETECTED Final   Parainfluenza Virus 4 NOT DETECTED NOT DETECTED Final   Respiratory Syncytial Virus NOT DETECTED NOT  DETECTED Final   Bordetella pertussis NOT DETECTED NOT DETECTED Final   Chlamydophila pneumoniae NOT DETECTED NOT DETECTED Final   Mycoplasma pneumoniae NOT DETECTED NOT DETECTED Final    Comment: Performed at [facility], [place] [address], [place], [place] [address]     Medications:   . amLODipine  10 mg Oral Daily  . amoxicillin-clavulanate  1 tablet Oral Q12H  . febuxostat  40 mg Oral Daily  . furosemide  10 mg Oral Daily  . gabapentin  300 mg Oral Daily  . heparin  5,000 Units Subcutaneous Q8H  . insulin aspart  0-9 Units Subcutaneous TID WC  . linagliptin  5 mg Oral Daily  . lisinopril  10 mg Oral Daily  . magnesium oxide  400 mg Oral Daily  . mouth rinse  15 mL Mouth Rinse BID  . metoprolol  200 mg Oral Daily  . rosuvastatin  5 mg Oral QPM   Continuous Infusions:    LOS: 3 days   [facility] Pager [phone]  *Please refer to [email or web address], password [email or web address] to get updated schedule on who will round on this patient, as hospitalists switch teams weekly. If 7PM-7AM, please contact night-coverage at [email or web address], password [email or web address] for any overnight needs.  08/10/2017, 8:48 AM

## 2017-08-10 NOTE — Progress Notes (Signed)
Pharmacy Antibiotic Note  Tonya Moon is a 67 y.o. female admitted on 08/07/2017 with fall.  Pharmacy has been consulted for vancomycin and unasyn dosing for FUO. T max 102.9 this morning on PO Augmentin.  Concern for possible sinusitis or L molar infection.   Plan: Vancomycin 2500 mg IV loading dose then vancomycin 1500 mg IV q48 for AUC est 478 using SCr 1.76 (IBW/ABW) Increase Unasyn from 1.5 gm q6h to 3 gm IV q6h for wt 128.5 kg F/u renal function, temp, wbc, cultures, Vancomycin levels as needed  Height: 5\' 5"  (165.1 cm) Weight: 283 lb 4.7 oz (128.5 kg) IBW/kg (Calculated) : 57  Temp (24hrs), Avg:99.9 F (37.7 C), Min:97.9 F (36.6 C), Max:102.9 F (39.4 C)  Recent Labs  Lab 08/07/17 1220 08/07/17 1331 08/08/17 0517  WBC 17.1*  --  12.8*  CREATININE  --  1.70* 1.76*    Estimated Creatinine Clearance: 43.1 mL/min (A) (by C-G formula based on SCr of 1.76 mg/dL (H)).    No Known Allergies Antimicrobials this admission:  3/15 Unasyn>> 3/14 Augmentin x 2 doses>3/14 3/15 vanc>> Dose adjustments: 3/15 Unasyn 1.5 q6>> unasyn 3 mg IV q6h Microbiology results:  3/13 HIV neg 3/12 resp panel neg 3/12 Ucx NGF 3/15 Bcx2>>  Thank you for allowing pharmacy to be a part of this patient's care.  Eudelia Bunch, Pharm.D. [phone] 08/10/2017 10:46 AM

## 2017-08-10 NOTE — Care Management Note (Signed)
Case Management Note  Patient Details  Name: Breona Cherubin MRN: [id number] Date of Birth: Jun 10, 1950  Subjective/Objective: PT-recc HHPT. Provided w/HHC agency list for choice-await choice.                   Action/Plan:d/c home w/HHC.   Expected Discharge Date:                  Expected Discharge Plan:  [place]  In-House Referral:     Discharge planning Services  CM Consult  Post Acute Care Choice:  Durable Medical Equipment(rw) Choice offered to:  Patient  DME Arranged:    DME Agency:     HH Arranged:    [place] Agency:     Status of Service:  In process, will continue to follow  If discussed at Long Length of Stay Meetings, dates discussed:    Additional Comments:  Dessa Phi, RN 08/10/2017, 3:54 PM

## 2017-08-10 NOTE — Care Management Important Message (Signed)
Important Message  Patient Details  Name: Glenn Christo MRN: [id number] Date of Birth: 03/07/1951   Medicare Important Message Given:  Yes    Kerin Salen 08/10/2017, 10:43 AMImportant Message  Patient Details  Name: Johany Hansman MRN: [id number] Date of Birth: 09-29-1950   Medicare Important Message Given:  Yes    Kerin Salen 08/10/2017, 10:43 AM

## 2017-08-11 LAB — CBC WITH DIFFERENTIAL/PLATELET
BASOS ABS: 0 10*3/uL (ref 0.0–0.1)
Basophils Relative: 0 %
EOS ABS: 0.2 10*3/uL (ref 0.0–0.7)
EOS PCT: 2 %
HCT: 36.1 % (ref 36.0–46.0)
Hemoglobin: 11.2 g/dL — ABNORMAL LOW (ref 12.0–15.0)
Lymphocytes Relative: 47 %
Lymphs Abs: 4.9 10*3/uL — ABNORMAL HIGH (ref 0.7–4.0)
MCH: 29.9 pg (ref 26.0–34.0)
MCHC: 31 g/dL (ref 30.0–36.0)
MCV: 96.5 fL (ref 78.0–100.0)
MONO ABS: 1.1 10*3/uL — AB (ref 0.1–1.0)
Monocytes Relative: 11 %
NEUTROS PCT: 40 %
Neutro Abs: 4.1 10*3/uL (ref 1.7–7.7)
PLATELETS: 213 10*3/uL (ref 150–400)
RBC: 3.74 MIL/uL — AB (ref 3.87–5.11)
RDW: 14.8 % (ref 11.5–15.5)
WBC: 10.3 10*3/uL (ref 4.0–10.5)

## 2017-08-11 LAB — BASIC METABOLIC PANEL
ANION GAP: 11 (ref 5–15)
Anion gap: 10 (ref 5–15)
BUN: 23 mg/dL — ABNORMAL HIGH (ref 6–20)
BUN: 21 mg/dL — ABNORMAL HIGH (ref 6–20)
CALCIUM: 8.5 mg/dL — AB (ref 8.9–10.3)
CALCIUM: 8.6 mg/dL — AB (ref 8.9–10.3)
CHLORIDE: 99 mmol/L — AB (ref 101–111)
CO2: 29 mmol/L (ref 22–32)
CO2: 28 mmol/L (ref 22–32)
CREATININE: 1.68 mg/dL — AB (ref 0.44–1.00)
CREATININE: 1.54 mg/dL — AB (ref 0.44–1.00)
Chloride: 98 mmol/L — ABNORMAL LOW (ref 101–111)
GFR, EST AFRICAN AMERICAN: 36 mL/min — AB (ref >60)
GFR, EST AFRICAN AMERICAN: 40 mL/min — AB (ref >60)
GFR, EST NON AFRICAN AMERICAN: 31 mL/min — AB (ref >60)
GFR, EST NON AFRICAN AMERICAN: 34 mL/min — AB (ref >60)
Glucose, Bld: 116 mg/dL — ABNORMAL HIGH (ref 65–99)
Glucose, Bld: 124 mg/dL — ABNORMAL HIGH (ref 65–99)
Potassium: 5.6 mmol/L — ABNORMAL HIGH (ref 3.5–5.1)
Potassium: 4.2 mmol/L (ref 3.5–5.1)
SODIUM: 138 mmol/L (ref 135–145)
SODIUM: 137 mmol/L (ref 135–145)

## 2017-08-11 LAB — GLUCOSE, CAPILLARY
GLUCOSE-CAPILLARY: 112 mg/dL — AB (ref 65–99)
GLUCOSE-CAPILLARY: 124 mg/dL — AB (ref 65–99)
Glucose-Capillary: 113 mg/dL — ABNORMAL HIGH (ref 65–99)
Glucose-Capillary: 125 mg/dL — ABNORMAL HIGH (ref 65–99)

## 2017-08-11 MED ORDER — POTASSIUM CHLORIDE CRYS ER 20 MEQ PO TBCR: 40.0000 meq | EXTENDED_RELEASE_TABLET | Freq: Two times a day (BID) | ORAL | Status: DC

## 2017-08-11 MED ORDER — FUROSEMIDE 10 MG/ML IJ SOLN: 40.0000 mg | Freq: Once | INTRAMUSCULAR | Status: DC

## 2017-08-11 MED ORDER — FUROSEMIDE 10 MG/ML IJ SOLN
40.0000 mg | Freq: Two times a day (BID) | INTRAMUSCULAR | Status: DC
  Administered 2017-08-11: 40 mg via INTRAVENOUS
  Filled 2017-08-11: qty 4

## 2017-08-11 NOTE — Plan of Care (Signed)
Patient without complaint this shift other than she wants to go home.  Attempted to explain to patient regarding her fevers at night and the need for IV antibiotic which patient says "I don't want to hear that".  Patient adamant there is nothing wrong with her and she can go home.  Patient very somnolent entire shift, easily awakened, woke up to use the bathroom and eat some of each meal but immediately back to sleep.  Patient receiving no meds that are sedating.  Maintaining oxygen saturation 96 to 100% on room air.  Daughter and husband in to visit.  Per daughter husband lives in [place] and patient resides alone at home.

## 2017-08-11 NOTE — Evaluation (Signed)
Clinical/Bedside Swallow Evaluation Patient Details  Name: Tonya Moon MRN: [id number] Date of Birth: 06/21/1950  Today's Date: 08/11/2017 Time: SLP Start Time (ACUTE ONLY): 60 SLP Stop Time (ACUTE ONLY): 1020 SLP Time Calculation (min) (ACUTE ONLY): 15 min  Past Medical History:  Past Medical History:  Diagnosis Date  . Cancer (HCC)    breast  . CHF (congestive heart failure) ([place])   . Diabetes mellitus without complication ([place])   . Hypertension    Past Surgical History:  Past Surgical History:  Procedure Laterality Date  . BREAST SURGERY     HPI:  Patient is a 67 y.o. female who was brought to the ED after a fall that had happened 2 days prior. Patient denied LOC but her husband reported some slurred speech. PMH: CKD, stage 3, DM-2, breast cancer on remission. MRI did not reveal any acute intracranial abnormality.   Assessment / Plan / Recommendation Clinical Impression  Patient presents with a functional oropharyngeal swallow with no overt s/s of aspiration or penetration observed, timely swallow initiation and good pharyngeal contraction per palpation. Patient did exhibit a slow mastication of hard solid, but likely this was impacted by her being quite lethargic.  SLP Visit Diagnosis: Dysphagia, unspecified (R13.10)    Aspiration Risk  No limitations    Diet Recommendation Regular;Thin liquid   Liquid Administration via: Cup;Straw Medication Administration: Whole meds with liquid    Other  Recommendations     Follow up Recommendations None      Frequency and Duration   N/A         Prognosis   N/A     Swallow Study   General Date of Onset: 08/07/17 HPI: Patient is a 67 y.o. female who was brought to the ED after a fall that had happened 2 days prior. Patient denied LOC but her husband reported some slurred speech. PMH: CKD, stage 3, DM-2, breast cancer on remission. MRI did not reveal any acute intracranial abnormality. Type of Study: Bedside Swallow  Evaluation Previous Swallow Assessment: N/A Diet Prior to this Study: Regular;Thin liquids Temperature Spikes Noted: No Respiratory Status: Room air History of Recent Intubation: No Behavior/Cognition: Cooperative;Lethargic/Drowsy;Requires cueing Oral Cavity Assessment: Within Functional Limits Oral Care Completed by SLP: No Oral Cavity - Dentition: Adequate natural dentition Vision: Functional for self-feeding Self-Feeding Abilities: Able to feed self Patient Positioning: Upright in chair Baseline Vocal Quality: Normal Volitional Cough: Cognitively unable to elicit Volitional Swallow: Unable to elicit    Oral/Motor/Sensory Function Overall Oral Motor/Sensory Function: Mild impairment Facial ROM: Reduced right Facial Symmetry: Abnormal symmetry right Lingual ROM: Within Functional Limits Lingual Symmetry: Within Functional Limits Lingual Strength: Within Functional Limits Mandible: Within Functional Limits   Ice Chips Ice chips: Not tested   Thin Liquid Thin Liquid: Within functional limits Presentation: Cup;Self Fed;Straw Other Comments: No overt s/s of aspiration or penetration and swallow initiation was timely.    Nectar Thick Nectar Thick Liquid: Not tested   Honey Thick Honey Thick Liquid: Not tested   Puree Puree: Not tested   Solid   GO   Solid: Impaired Oral Phase Impairments: Impaired mastication Oral Phase Functional Implications: Impaired mastication Other Comments: Slow mastication of hard solid (graham cracker), however patient was lethargic, which likely contributed to this.        Sonia Baller, MA, CCC-SLP 08/11/17 2:05 PM

## 2017-08-11 NOTE — Progress Notes (Signed)
[facility] PROGRESS NOTE    Progress Note  Tonya Moon  CZY:[id number] DOB: 12/16/1950 DOA: 08/07/2017 PCP: [facility]     Brief Narrative:   Tonya Moon is an 67 y.o. female past medical history of breast cancer on remission, chronic kidney disease stage III, diabetes mellitus type 2, who was brought to the ED after a fall that happened 2 days prior to admission, the patient denies any loss of consciousness she was held by her husband.  But her husband relate there was some slurred speech him a CT head scan of the head was unremarkable  Assessment/Plan:   Acute respiratory failure with hypoxia ([place]) VQ scan is pending as patient refused, but she says she will try today. Lower extremity Doppler negative for DVT. 2D echo showed an ejection fraction of 09% with diastolic dysfunction. She had a mild elevated BNP, new chest x-ray showed possible pulmonary edema, will start her on IV Lasix, check a basic metabolic panel in the morning.  New onset fever: Patient was complaining of left-sided maxillary pain CT scan showed no abscess She had a CT scan of the chest on admission that showed lymphadenopathy which could be the source of infection. Of unclear source of fever which is trending down, will continue IV vancomycin and Unasyn. Swallowing evaluation is pending.  Possible slurred speech: MRI of the brain showed no acute findings Physical therapy consult recommended home health PT.  Type 2 diabetes mellitus with renal manifestations ([place]) Well controlled, change in current management.  Pulmonary nodule on CT scan: Chest will need further workup as an outpatient  CKD (chronic kidney disease) stage 3, GFR 30-59 ml/min (HCC) Has remained at baseline.  Essential hypertension Continue ACE inhibitor, beta-blocker and amlodipine. Blood pressure has remained stable.  Normocytic normochromic anemia Follow-up with PCP as an outpatient.  DVT prophylaxis:  lovenox Family Communication:daughter Disposition Plan/Barrier to D/C: home 2-3 days once culture data is back.  Code Status:     Code Status Orders  (From admission, onward)        Start     Ordered   08/08/17 0313  Full code  Continuous     08/08/17 0316    Code Status History    Date Active Date Inactive Code Status Order ID Comments User Context   This patient has a current code status but no historical code status.        IV Access:    Peripheral IV   Procedures and diagnostic studies:   Dg Chest 2 View  Result Date: 08/10/2017 CLINICAL DATA:  Short of breath EXAM: CHEST - 2 VIEW COMPARISON:  08/07/2017 FINDINGS: The heart is moderately enlarged. Mild vascular congestion. No interstitial edema. Right ventricle outflow tract is prominent. No pneumothorax. No pleural effusion. IMPRESSION: Cardiomegaly and vascular congestion without interstitial edema. Electronically Signed   By: Marybelle Killings M.D.   On: 08/10/2017 10:53   Nm Pulmonary Perf And Vent  Result Date: 08/10/2017 CLINICAL DATA:  Shortness of breath and CHF. Evaluate for pulmonary embolus. EXAM: NUCLEAR MEDICINE VENTILATION - PERFUSION LUNG SCAN TECHNIQUE: Ventilation images were obtained in multiple projections using inhaled aerosol Tc-61m DTPA. Perfusion images were obtained in multiple projections after intravenous injection of Tc-103m-MAA. RADIOPHARMACEUTICALS:  31.9 mCi of Tc-19m DTPA aerosol inhalation and 4.0 mCi Tc79m-MAA IV COMPARISON:  None FINDINGS: Ventilation: No focal ventilation defect. Central airway deposition of the radiopharmaceutical may reflect sequelae of obstructive pulmonary disease. Perfusion: No wedge shaped peripheral perfusion defects to suggest acute pulmonary  embolism. IMPRESSION: 1. No evidence for acute pulmonary embolus. Electronically Signed   By: Kerby Moors M.D.   On: 08/10/2017 11:53   [place] Cm  Result Date: 08/10/2017 CLINICAL DATA:  Fever of unknown  origin EXAM: CT PARANASAL SINUS LIMITED WITHOUT CONTRAST TECHNIQUE: Non-contiguous multidetector CT images of the paranasal sinuses were obtained in a single plane without contrast. COMPARISON:  Brain MRI from 2 days prior FINDINGS: Generalized mucosal thickening in the paranasal sinuses, greatest in the maxillary and ethmoid sinuses. A right maxillary fluid level seen on comparison brain MRI is resolved. No incidental orbital, soft tissue, or osseous abnormality. IMPRESSION: Generalized moderate sinusitis without progression since brain MRI 2 days prior. A right maxillary fluid level seen on MRI is not visualized today. Electronically Signed   By: Monte Fantasia M.D.   On: 08/10/2017 10:44     Medical Consultants:    None.  Anti-Infectives:   None  Subjective:    Tonya Moon is more awake today wants to go home denies any pain.  Objective:    Vitals:   08/11/17 0500 08/11/17 0551 08/11/17 0730 08/11/17 0746  BP:  131/85    Pulse:  92    Resp:  (!) 22    Temp:  99.6 F (37.6 C)    TempSrc:  Oral    SpO2:  100% 100% 100%  Weight: 128.7 kg (283 lb 11.7 oz)     Height:        Intake/Output Summary (Last 24 hours) at 08/11/2017 0800 Last data filed at 08/11/2017 0600 Gross per 24 hour  Intake 1480 ml  Output -  Net 1480 ml   Filed Weights   08/09/17 0500 08/10/17 0500 08/11/17 0500  Weight: 128 kg (282 lb 3 oz) 128.5 kg (283 lb 4.7 oz) 128.7 kg (283 lb 11.7 oz)    Exam: General exam: In no acute distress, poor oral hygiene. Respiratory system: Good air movement and clear to auscultation. Cardiovascular system: S1 & S2 heard, RRR. No JVD.  Gastrointestinal system: Abdomen is nondistended, soft and nontender.  Central nervous system: Awake alert and oriented x3 nonfocal on physical exam Extremities: No pedal edema. Skin: Dry skin.   Data Reviewed:    Labs: Basic Metabolic Panel: Recent Labs  Lab 08/07/17 1331 08/08/17 0517  NA 136 140  K 4.0 4.0  CL 99*  102  CO2 28 27  GLUCOSE 124* 142*  BUN 22* 25*  CREATININE 1.70* 1.76*  CALCIUM 8.5* 8.7*  MG  --  1.8   GFR Estimated Creatinine Clearance: 43.1 mL/min (A) (by C-G formula based on SCr of 1.76 mg/dL (H)). Liver Function Tests: Recent Labs  Lab 08/07/17 1331  AST 35  ALT 16  ALKPHOS 65  BILITOT 0.6  PROT 8.1  ALBUMIN 3.6   No results for input(s): LIPASE, AMYLASE in the last 168 hours. No results for input(s): AMMONIA in the last 168 hours. Coagulation profile No results for input(s): INR, PROTIME in the last 168 hours.  CBC: Recent Labs  Lab 08/07/17 1220 08/08/17 0517  WBC 17.1* 12.8*  NEUTROABS 12.7*  --   HGB 11.7* 11.1*  HCT 35.3* 35.1*  MCV 91.9 95.4  PLT 170 189   Cardiac Enzymes: Recent Labs  Lab 08/07/17 1220 08/08/17 0517 08/08/17 0953 08/08/17 1559  TROPONINI 0.05* 0.05* 0.03* 0.03*   BNP (last 3 results) No results for input(s): PROBNP in the last 8760 hours. CBG: Recent Labs  Lab 08/10/17 0723 08/10/17 1136  08/10/17 1622 08/10/17 2040 08/11/17 0719  GLUCAP 130* 139* 145* 129* 112*   D-Dimer: No results for input(s): DDIMER in the last 72 hours. Hgb A1c: No results for input(s): HGBA1C in the last 72 hours. Lipid Profile: No results for input(s): CHOL, HDL, LDLCALC, TRIG, CHOLHDL, LDLDIRECT in the last 72 hours. Thyroid function studies: No results for input(s): TSH, T4TOTAL, T3FREE, THYROIDAB in the last 72 hours.  Invalid input(s): FREET3 Anemia work up: No results for input(s): VITAMINB12, FOLATE, FERRITIN, TIBC, IRON, RETICCTPCT in the last 72 hours. Sepsis Labs: Recent Labs  Lab 08/07/17 1220 08/08/17 0517  WBC 17.1* 12.8*   Microbiology Recent Results (from the past 240 hour(s))  Urine culture     Status: None   Collection Time: 08/07/17 12:54 PM  Result Value Ref Range Status   Specimen Description   Final    URINE, CATHETERIZED Performed at [facility], [address] [place], [place] [address]     Special Requests   Final    Normal Performed at [facility], [address] [place], [place] [address]    Culture   Final    NO GROWTH Performed at [facility], [place] [address], [place], [place] [address]    Report Status 08/08/2017 FINAL  Final  Respiratory Panel by PCR     Status: None   Collection Time: 08/07/17  3:15 PM  Result Value Ref Range Status   Adenovirus NOT DETECTED NOT DETECTED Final   Coronavirus 229E NOT DETECTED NOT DETECTED Final   Coronavirus HKU1 NOT DETECTED NOT DETECTED Final   Coronavirus NL63 NOT DETECTED NOT DETECTED Final   Coronavirus OC43 NOT DETECTED NOT DETECTED Final   Metapneumovirus NOT DETECTED NOT DETECTED Final   Rhinovirus / Enterovirus NOT DETECTED NOT DETECTED Final   Influenza A NOT DETECTED NOT DETECTED Final   Influenza B NOT DETECTED NOT DETECTED Final   Parainfluenza Virus 1 NOT DETECTED NOT DETECTED Final   Parainfluenza Virus 2 NOT DETECTED NOT DETECTED Final   Parainfluenza Virus 3 NOT DETECTED NOT DETECTED Final   Parainfluenza Virus 4 NOT DETECTED NOT DETECTED Final   Respiratory Syncytial Virus NOT DETECTED NOT DETECTED Final   Bordetella pertussis NOT DETECTED NOT DETECTED Final   Chlamydophila pneumoniae NOT DETECTED NOT DETECTED Final   Mycoplasma pneumoniae NOT DETECTED NOT DETECTED Final    Comment: Performed at [facility], [place] [address] [place], [place] [address]     Medications:   . amLODipine  10 mg Oral Daily  . febuxostat  40 mg Oral Daily  . furosemide  10 mg Oral Daily  . gabapentin  300 mg Oral Daily  . heparin  5,000 Units Subcutaneous Q8H  . insulin aspart  0-9 Units Subcutaneous TID WC  . linagliptin  5 mg Oral Daily  . lisinopril  10 mg Oral Daily  . magnesium oxide  400 mg Oral Daily  . mouth rinse  15 mL Mouth Rinse BID  . metoprolol  200 mg Oral Daily  . rosuvastatin  5 mg Oral QPM   Continuous Infusions: . ampicillin-sulbactam (UNASYN) IV Stopped (08/11/17 6759)  .  [START ON 08/12/2017] vancomycin        LOS: 4 days   Charlynne Cousins  [facility] Pager [phone]  *Please refer to [email or web address], password [email or web address] to get updated schedule on who will round on this patient, as hospitalists switch teams weekly. If 7PM-7AM, please contact night-coverage at [email or web address],  password [email or web address] for any overnight needs.  08/11/2017, 8:00 AM

## 2017-08-12 LAB — BASIC METABOLIC PANEL
Anion gap: 11 (ref 5–15)
BUN: 22 mg/dL — ABNORMAL HIGH (ref 6–20)
CALCIUM: 8.6 mg/dL — AB (ref 8.9–10.3)
CO2: 26 mmol/L (ref 22–32)
CREATININE: 1.74 mg/dL — AB (ref 0.44–1.00)
Chloride: 102 mmol/L (ref 101–111)
GFR calc Af Amer: 34 mL/min — ABNORMAL LOW (ref >60)
GFR calc non Af Amer: 30 mL/min — ABNORMAL LOW (ref >60)
GLUCOSE: 135 mg/dL — AB (ref 65–99)
Potassium: 6 mmol/L — ABNORMAL HIGH (ref 3.5–5.1)
Sodium: 139 mmol/L (ref 135–145)

## 2017-08-12 LAB — GLUCOSE, CAPILLARY
GLUCOSE-CAPILLARY: 115 mg/dL — AB (ref 65–99)
GLUCOSE-CAPILLARY: 127 mg/dL — AB (ref 65–99)
GLUCOSE-CAPILLARY: 128 mg/dL — AB (ref 65–99)
Glucose-Capillary: 199 mg/dL — ABNORMAL HIGH (ref 65–99)

## 2017-08-12 MED ORDER — SODIUM POLYSTYRENE SULFONATE PO POWD
30.0000 g | Freq: Once | ORAL | Status: AC
  Administered 2017-08-12: 30 g via ORAL
  Filled 2017-08-12: qty 30

## 2017-08-12 MED ORDER — FUROSEMIDE 20 MG PO TABS
20.0000 mg | ORAL_TABLET | Freq: Every day | ORAL | Status: DC
  Administered 2017-08-12 – 2017-08-13 (×2): 20 mg via ORAL
  Filled 2017-08-12 (×2): qty 1

## 2017-08-12 NOTE — Plan of Care (Signed)
Patient without complaint this shift, maintains oxygen saturation mid to high 90's on room air.

## 2017-08-12 NOTE — Progress Notes (Signed)
[facility] PROGRESS NOTE    Progress Note  Tonya Moon  LPF:[id number] DOB: Oct 15, 1950 DOA: 08/07/2017 PCP: [facility]     Brief Narrative:   Tonya Moon is an 67 y.o. female past medical history of breast cancer on remission, chronic kidney disease stage III, diabetes mellitus type 2, who was brought to the ED after a fall that happened 2 days prior to admission, the patient denies any loss of consciousness she was held by her husband.  But her husband relate there was some slurred speech him a CT head scan of the head was unremarkable  Assessment/Plan:   Acute respiratory failure with hypoxia (HCC) VQ scan was negative for PE. Lower extremity Doppler negative for DVT. 2D echo showed an ejection fraction of 53% with diastolic dysfunction. On oral Lasix now off oxygen.  New onset fever: Patient was complaining of left-sided maxillary pain CT scan showed no abscess She had a CT scan of the chest on admission that showed lymphadenopathy which could be the source of infection. She was started empirically on IV antibiotics IV vancomycin and Unasyn and she has defervesced.  Culture data has remain negative till date.  Possible slurred speech: MRI of the brain showed no acute findings Physical therapy consult recommended home health PT.  Type 2 diabetes mellitus with renal manifestations ([place]) Well controlled, change in current management.  Pulmonary nodule on CT scan: Chest will need further workup as an outpatient  CKD (chronic kidney disease) stage 3, GFR 30-59 ml/min (HCC) Has remained at baseline.  Essential hypertension Continue ACE inhibitor, beta-blocker and amlodipine. Blood pressure has remained stable.  Normocytic normochromic anemia Follow-up with PCP as an outpatient.  Hyperkalemia: She has been also ACE inhibitor and potassium supplementations and despite this her potassium has has increased we will start her on oral  Kayexalate.   DVT prophylaxis: lovenox Family Communication:daughter Disposition Plan/Barrier to D/C: home in am  Code Status:     Code Status Orders  (From admission, onward)        Start     Ordered   08/08/17 0313  Full code  Continuous     08/08/17 0316    Code Status History    Date Active Date Inactive Code Status Order ID Comments User Context   This patient has a current code status but no historical code status.        IV Access:    Peripheral IV   Procedures and diagnostic studies:   Dg Chest 2 View  Result Date: 08/10/2017 CLINICAL DATA:  Short of breath EXAM: CHEST - 2 VIEW COMPARISON:  08/07/2017 FINDINGS: The heart is moderately enlarged. Mild vascular congestion. No interstitial edema. Right ventricle outflow tract is prominent. No pneumothorax. No pleural effusion. IMPRESSION: Cardiomegaly and vascular congestion without interstitial edema. Electronically Signed   By: Marybelle Killings M.D.   On: 08/10/2017 10:53   Nm Pulmonary Perf And Vent  Result Date: 08/10/2017 CLINICAL DATA:  Shortness of breath and CHF. Evaluate for pulmonary embolus. EXAM: NUCLEAR MEDICINE VENTILATION - PERFUSION LUNG SCAN TECHNIQUE: Ventilation images were obtained in multiple projections using inhaled aerosol Tc-6m DTPA. Perfusion images were obtained in multiple projections after intravenous injection of Tc-9m-MAA. RADIOPHARMACEUTICALS:  31.9 mCi of Tc-67m DTPA aerosol inhalation and 4.0 mCi Tc53m-MAA IV COMPARISON:  None FINDINGS: Ventilation: No focal ventilation defect. Central airway deposition of the radiopharmaceutical may reflect sequelae of obstructive pulmonary disease. Perfusion: No wedge shaped peripheral perfusion defects to suggest acute pulmonary embolism. IMPRESSION: 1.  No evidence for acute pulmonary embolus. Electronically Signed   By: Kerby Moors M.D.   On: 08/10/2017 11:53   [place] Cm  Result Date: 08/10/2017 CLINICAL DATA:  Fever of unknown  origin EXAM: CT PARANASAL SINUS LIMITED WITHOUT CONTRAST TECHNIQUE: Non-contiguous multidetector CT images of the paranasal sinuses were obtained in a single plane without contrast. COMPARISON:  Brain MRI from 2 days prior FINDINGS: Generalized mucosal thickening in the paranasal sinuses, greatest in the maxillary and ethmoid sinuses. A right maxillary fluid level seen on comparison brain MRI is resolved. No incidental orbital, soft tissue, or osseous abnormality. IMPRESSION: Generalized moderate sinusitis without progression since brain MRI 2 days prior. A right maxillary fluid level seen on MRI is not visualized today. Electronically Signed   By: Monte Fantasia M.D.   On: 08/10/2017 10:44     Medical Consultants:    None.  Anti-Infectives:   None  Subjective:    Tonya Moon she is not in a good mood as she wanted to go home today and I have explained to her that she cannot go home today due to her high potassium.  Objective:    Vitals:   08/11/17 1301 08/11/17 2003 08/12/17 0000 08/12/17 0503  BP: 96/67 92/72 103/70 112/68  Pulse: 79 80  86  Resp: 18 20  20   Temp: 99.5 F (37.5 C) 98.8 F (37.1 C)  98.3 F (36.8 C)  TempSrc: Oral Oral  Oral  SpO2: 100% 100%  100%  Weight:      Height:        Intake/Output Summary (Last 24 hours) at 08/12/2017 0924 Last data filed at 08/12/2017 0700 Gross per 24 hour  Intake 1660 ml  Output 1403 ml  Net 257 ml   Filed Weights   08/09/17 0500 08/10/17 0500 08/11/17 0500  Weight: 128 kg (282 lb 3 oz) 128.5 kg (283 lb 4.7 oz) 128.7 kg (283 lb 11.7 oz)    Exam: General exam: In no acute distress, poor oral hygiene. Respiratory system: Good air movement and clear to auscultation. Cardiovascular system: S1 & S2 heard, RRR. No JVD.  Gastrointestinal system: Abdomen is nondistended, soft and nontender.  Central nervous system: Awake alert and oriented x3 nonfocal on physical exam Extremities: No pedal edema. Skin: Dry  skin.   Data Reviewed:    Labs: Basic Metabolic Panel: Recent Labs  Lab 08/07/17 1331 08/08/17 0517 08/11/17 0529 08/11/17 1214 08/12/17 0517  NA 136 140 138 137 139  K 4.0 4.0 5.6* 4.2 6.0*  CL 99* 102 99* 98* 102  CO2 28 27 29 28 26   GLUCOSE 124* 142* 116* 124* 135*  BUN 22* 25* 23* 21* 22*  CREATININE 1.70* 1.76* 1.68* 1.54* 1.74*  CALCIUM 8.5* 8.7* 8.5* 8.6* 8.6*  MG  --  1.8  --   --   --    GFR Estimated Creatinine Clearance: 43.6 mL/min (A) (by C-G formula based on SCr of 1.74 mg/dL (H)). Liver Function Tests: Recent Labs  Lab 08/07/17 1331  AST 35  ALT 16  ALKPHOS 65  BILITOT 0.6  PROT 8.1  ALBUMIN 3.6   No results for input(s): LIPASE, AMYLASE in the last 168 hours. No results for input(s): AMMONIA in the last 168 hours. Coagulation profile No results for input(s): INR, PROTIME in the last 168 hours.  CBC: Recent Labs  Lab 08/07/17 1220 08/08/17 0517 08/11/17 0529  WBC 17.1* 12.8* 10.3  NEUTROABS 12.7*  --  4.1  HGB  11.7* 11.1* 11.2*  HCT 35.3* 35.1* 36.1  MCV 91.9 95.4 96.5  PLT 170 189 213   Cardiac Enzymes: Recent Labs  Lab 08/07/17 1220 08/08/17 0517 08/08/17 0953 08/08/17 1559  TROPONINI 0.05* 0.05* 0.03* 0.03*   BNP (last 3 results) No results for input(s): PROBNP in the last 8760 hours. CBG: Recent Labs  Lab 08/11/17 0719 08/11/17 1133 08/11/17 1629 08/11/17 2205 08/12/17 0715  GLUCAP 112* 113* 125* 124* 115*   D-Dimer: No results for input(s): DDIMER in the last 72 hours. Hgb A1c: No results for input(s): HGBA1C in the last 72 hours. Lipid Profile: No results for input(s): CHOL, HDL, LDLCALC, TRIG, CHOLHDL, LDLDIRECT in the last 72 hours. Thyroid function studies: No results for input(s): TSH, T4TOTAL, T3FREE, THYROIDAB in the last 72 hours.  Invalid input(s): FREET3 Anemia work up: No results for input(s): VITAMINB12, FOLATE, FERRITIN, TIBC, IRON, RETICCTPCT in the last 72 hours. Sepsis Labs: Recent Labs  Lab  08/07/17 1220 08/08/17 0517 08/11/17 0529  WBC 17.1* 12.8* 10.3   Microbiology Recent Results (from the past 240 hour(s))  Urine culture     Status: None   Collection Time: 08/07/17 12:54 PM  Result Value Ref Range Status   Specimen Description   Final    URINE, CATHETERIZED Performed at [facility], [address] [place], [place] [address]    Special Requests   Final    Normal Performed at [facility], [address] [place], [place] [address]    Culture   Final    NO GROWTH Performed at [facility], [place] [address], [place], [place] [address]    Report Status 08/08/2017 FINAL  Final  Respiratory Panel by PCR     Status: None   Collection Time: 08/07/17  3:15 PM  Result Value Ref Range Status   Adenovirus NOT DETECTED NOT DETECTED Final   Coronavirus 229E NOT DETECTED NOT DETECTED Final   Coronavirus HKU1 NOT DETECTED NOT DETECTED Final   Coronavirus NL63 NOT DETECTED NOT DETECTED Final   Coronavirus OC43 NOT DETECTED NOT DETECTED Final   Metapneumovirus NOT DETECTED NOT DETECTED Final   Rhinovirus / Enterovirus NOT DETECTED NOT DETECTED Final   Influenza A NOT DETECTED NOT DETECTED Final   Influenza B NOT DETECTED NOT DETECTED Final   Parainfluenza Virus 1 NOT DETECTED NOT DETECTED Final   Parainfluenza Virus 2 NOT DETECTED NOT DETECTED Final   Parainfluenza Virus 3 NOT DETECTED NOT DETECTED Final   Parainfluenza Virus 4 NOT DETECTED NOT DETECTED Final   Respiratory Syncytial Virus NOT DETECTED NOT DETECTED Final   Bordetella pertussis NOT DETECTED NOT DETECTED Final   Chlamydophila pneumoniae NOT DETECTED NOT DETECTED Final   Mycoplasma pneumoniae NOT DETECTED NOT DETECTED Final    Comment: Performed at [facility], [place] [address] [place], [place] [address]  Culture, blood (Routine X 2) w Reflex to ID Panel     Status: None (Preliminary result)   Collection Time: 08/10/17 11:13 AM  Result Value Ref Range Status   Specimen  Description   Final    BLOOD RIGHT HAND Performed at [facility] [address] [place], [place] [address]    Special Requests   Final    BOTTLES DRAWN AEROBIC AND ANAEROBIC Blood Culture adequate volume Performed at [facility] [address] [place], [place] [address]    Culture   Final    NO GROWTH 1 DAY Performed  at [facility], [place] [address], [place], [place] [address]    Report Status PENDING  Incomplete  Culture, blood (Routine X 2) w Reflex to ID Panel     Status: None (Preliminary result)   Collection Time: 08/10/17 11:18 AM  Result Value Ref Range Status   Specimen Description   Final    BLOOD LEFT HAND Performed at [facility] [address] [place], [place] [address]    Special Requests   Final    BOTTLES DRAWN AEROBIC ONLY Blood Culture adequate volume Performed at [facility] [address], [place], [place] [address]    Culture   Final    NO GROWTH 1 DAY Performed at [facility], [place] [address] [place], [place] [address]    Report Status PENDING  Incomplete     Medications:   . amLODipine  10 mg Oral Daily  . febuxostat  40 mg Oral Daily  . gabapentin  300 mg Oral Daily  . heparin  5,000 Units Subcutaneous Q8H  . insulin aspart  0-9 Units Subcutaneous TID WC  . linagliptin  5 mg Oral Daily  . magnesium oxide  400 mg Oral Daily  . mouth rinse  15 mL Mouth Rinse BID  . metoprolol  200 mg Oral Daily  . rosuvastatin  5 mg Oral QPM   Continuous Infusions: . ampicillin-sulbactam (UNASYN) IV 3 g (08/12/17 0510)  . vancomycin        LOS: 5 days   [facility] Pager [phone]  *Please refer to [email or web address], password [email or web address] to get updated schedule on who will round on this patient, as hospitalists switch teams weekly. If 7PM-7AM, please contact night-coverage at [email or web address], password [email or web address] for any overnight needs.  08/12/2017,  9:24 AM

## 2017-08-13 DIAGNOSIS — R509 Fever, unspecified: Secondary | ICD-10-CM

## 2017-08-13 DIAGNOSIS — D649 Anemia, unspecified: Secondary | ICD-10-CM

## 2017-08-13 LAB — BASIC METABOLIC PANEL
ANION GAP: 9 (ref 5–15)
BUN: 15 mg/dL (ref 6–20)
CO2: 30 mmol/L (ref 22–32)
Calcium: 8.3 mg/dL — ABNORMAL LOW (ref 8.9–10.3)
Chloride: 100 mmol/L — ABNORMAL LOW (ref 101–111)
Creatinine, Ser: 1.24 mg/dL — ABNORMAL HIGH (ref 0.44–1.00)
GFR calc Af Amer: 52 mL/min — ABNORMAL LOW (ref >60)
GFR, EST NON AFRICAN AMERICAN: 45 mL/min — AB (ref >60)
Glucose, Bld: 108 mg/dL — ABNORMAL HIGH (ref 65–99)
POTASSIUM: 3.7 mmol/L (ref 3.5–5.1)
Sodium: 139 mmol/L (ref 135–145)

## 2017-08-13 LAB — GLUCOSE, CAPILLARY: Glucose-Capillary: 109 mg/dL — ABNORMAL HIGH (ref 65–99)

## 2017-08-13 MED ORDER — AMOXICILLIN-POT CLAVULANATE 875-125 MG PO TABS: 1.0000 | ORAL_TABLET | Freq: Two times a day (BID) | ORAL | 0 refills | Status: DC

## 2017-08-13 MED ORDER — AMOXICILLIN-POT CLAVULANATE 875-125 MG PO TABS: 1.0000 | ORAL_TABLET | Freq: Two times a day (BID) | ORAL | 0 refills | Status: AC

## 2017-08-13 NOTE — Discharge Summary (Signed)
Physician Discharge Summary  Tonya Moon NIO:[id number] DOB: [birth year] DOA: 08/07/2017  PCP: [facility]  Admit date: 08/07/2017 Discharge date: 08/13/2017  Admitted From: Home Disposition:  Home  Recommendations for Outpatient Follow-up:  1. Follow up with PCP in 1-2 weeks 2. Please obtain BMP/CBC in one week 3. .  CT scan of the chest without contrast to evaluate lymphadenopathy.   Home Health:Yes Equipment/Devices:none  Discharge Condition:stable CODE STATUS:full Diet recommendation: Heart Healthy   Brief/Interim Summary: 67 y.o. female past medical history of breast cancer on remission, chronic kidney disease stage III, diabetes mellitus type 2, who was brought to the ED after a fall that happened 2 days prior to admission, the patient denies any loss of consciousness she was held by her husband.  But her husband relate there was some slurred speech him a CT head scan of the head was unremarkable  Discharge Diagnoses:  Principal Problem:   Acute respiratory failure with hypoxia ([place]) Active Problems:   Type 2 diabetes mellitus with renal manifestations (HCC)   CKD (chronic kidney disease) stage 3, GFR 30-59 ml/min (HCC)   Essential hypertension   Normocytic normochromic anemia  Acute respiratory failure with hypoxia likely due to acute bronchitis: Lower extremity Doppler was negative for DVT, VQ scan was negative for PE. 2D echo showed an ejection fraction of 65%, by the third day she was having fevers, she was complaining of left maxillary pain CT to the facial scan showed no abscesses. Scan of the chest showed no infiltrates but did show lymphadenopathy she was started empirically on IV Unasyn and vancomycin she defervesced she was changed to oral Augmentin which she will continue as an outpatient. Culture data remain negative. She will need a CT scan in 3-6 months to reevaluate her lymphadenopathy. She also had a slurred speech and admission MRI of the  brain was done that showed no acute findings. Physical therapy evaluated the patient the recommended home health PT.  Diabetes mellitus type 2: No changes were made to her medication.  Pulmonary nodule on CT scan: She will need a CT scan in 3-6 months to reevaluate pulmonary nodule.  Chronic kidney disease stage III: Her creatinine remained at baseline.  Essential hypertension: No changes were made to her medication.  Normocytic anemia: Follow-up with PCP as an outpatient.  Next line  Hyperkalemia: In the setting of ACE inhibitor and supplementation this resolved with withholding ACE inhibitor and supplementation.  Discharge Instructions  Discharge Instructions    Diet - low sodium heart healthy   Complete by:  As directed    Increase activity slowly   Complete by:  As directed      Allergies as of 08/13/2017   No Known Allergies     Medication List    STOP taking these medications   doxycycline 100 MG tablet Commonly known as:  VIBRA-TABS     TAKE these medications   amLODipine 10 MG tablet Commonly known as:  NORVASC Take 10 mg by mouth daily.   amoxicillin-clavulanate 875-125 MG tablet Commonly known as:  AUGMENTIN Take 1 tablet by mouth every 12 (twelve) hours for 7 days.   benazepril-hydrochlorthiazide 10-12.5 MG tablet Commonly known as:  LOTENSIN HCT Take 1 tablet by mouth daily.   furosemide 20 MG tablet Commonly known as:  LASIX Take 10 mg by mouth daily.   gabapentin 300 MG capsule Commonly known as:  NEURONTIN Take 300 mg by mouth daily.   lisinopril 20 MG tablet Commonly known as:  PRINIVIL,ZESTRIL  Take 10 mg by mouth daily.   magnesium oxide 400 MG tablet Commonly known as:  MAG-OX Take 400 mg by mouth daily.   metoprolol 200 MG 24 hr tablet Commonly known as:  TOPROL-XL Take 200 mg by mouth daily. Take with or immediately following a meal.   PROAIR HFA IN Inhale 1 puff into the lungs every 8 (eight) hours as needed.    rosuvastatin 5 MG tablet Commonly known as:  CRESTOR Take 5 mg by mouth every evening.   TRADJENTA 5 MG Tabs tablet Generic drug:  linagliptin Take 5 mg by mouth daily.   ULORIC 40 MG tablet Generic drug:  febuxostat Take 40 mg by mouth daily.   VICKS VAPOR INHALER IN Inhale into the lungs as needed.   Vitamin D (Ergocalciferol) 50000 units Caps capsule Commonly known as:  DRISDOL Take 50,000 Units by mouth every 7 (seven) days.       No Known Allergies  Consultations:  None   Procedures/Studies: Dg Chest 2 View  Result Date: 08/10/2017 CLINICAL DATA:  Short of breath EXAM: CHEST - 2 VIEW COMPARISON:  08/07/2017 FINDINGS: The heart is moderately enlarged. Mild vascular congestion. No interstitial edema. Right ventricle outflow tract is prominent. No pneumothorax. No pleural effusion. IMPRESSION: Cardiomegaly and vascular congestion without interstitial edema. Electronically Signed   By: Marybelle Killings M.D.   On: 08/10/2017 10:53   Dg Chest 2 View  Result Date: 08/07/2017 CLINICAL DATA:  Recent fall. Chest pain. Cough and chest congestion. EXAM: CHEST - 2 VIEW COMPARISON:  02/15/2015 FINDINGS: The heart size and mediastinal contours are within normal limits. Aortic atherosclerosis. Both lungs are clear. Surgical clips again seen in right axilla. The visualized skeletal structures are unremarkable. IMPRESSION: No active cardiopulmonary disease. Electronically Signed   By: Earle Gell M.D.   On: 08/07/2017 10:44   Ct Head Wo Contrast  Result Date: 08/07/2017 CLINICAL DATA:  Pain following fall.  History of breast carcinoma EXAM: CT HEAD WITHOUT CONTRAST TECHNIQUE: Contiguous axial images were obtained from the base of the skull through the vertex without intravenous contrast. COMPARISON:  August 06, 2017. FINDINGS: Brain: The ventricles are normal in size and configuration. There is no intracranial mass, hemorrhage, extra-axial fluid collection, or midline shift. Patchy small  vessel disease in the centra semiovale bilaterally is stable. There is no new gray-white compartment lesion evident. No acute infarct appreciable. Mild basal ganglia calcification is felt to be physiologic in this age group. Vascular: No hyperdense vessel. There is calcification in each carotid siphon region. Skull: The bony calvarium appears intact. Sinuses/Orbits: There is opacification of most of the left maxillary antrum. There is mucosal thickening to a much lesser extent in the right maxillary antrum. There is opacification of several ethmoid air cells bilaterally. There is mild patchy opacity in the right sphenoid sinus with mucosal thickening in both anterior sphenoid regions. Orbits appear symmetric bilaterally. Other: Mastoid air cells are clear. IMPRESSION: Patchy supratentorial small vessel disease. No acute infarct evident. No mass or hemorrhage. Foci of arteriovascular calcification noted. There are multiple foci paranasal sinus disease, stable from 1 day prior. Electronically Signed   By: Lowella Grip III M.D.   On: 08/07/2017 12:16   Ct Chest Wo Contrast  Result Date: 08/07/2017 CLINICAL DATA:  Shortness of breath today.  Fell yesterday. EXAM: CT CHEST WITHOUT CONTRAST TECHNIQUE: Multidetector CT imaging of the chest was performed following the standard protocol without IV contrast. COMPARISON:  Chest radiograph August 07, 2017 and CT chest  February 16, 2015 FINDINGS: CARDIOVASCULAR: Heart size is normal. No pericardial collections. Severe coronary artery calcifications. Thoracic aorta is normal course and caliber, moderate calcific atherosclerosis. Main pulmonary is enlarged at 4.5 cm seen with chronic pulmonary arterial hypertension. Pulmonary embolism not excluded by noncontrast CT. MEDIASTINUM/NODES: No mediastinal mass. No lymphadenopathy by CT size criteria though sensitivity decreased without intravenous contrast. Similar subcentimeter mediastinal lymph nodes. Normal appearance of  thoracic esophagus though not tailored for evaluation. LUNGS/PLEURA: Tracheobronchial tree is patent, no pneumothorax. No pleural effusions, focal consolidations, or masses. Mild bronchiectasis versus respiratory motion artifact LEFT lower lobe. Lingular and LEFT lower lobe atelectasis/scarring. New solid irregular 9 mm and adjacent sub solid 4 mm pulmonary nodules RIGHT upper lobe (series 4, image 55/141). Stable appearance of 4 mm LEFT upper lobe sub solid pulmonary nodule. UPPER ABDOMEN: Nonacute.  Very small hiatal hernia. MUSCULOSKELETAL: Nonacute. Spiculated 12 x 23 mm RIGHT anterior chest wall mass was 13 x 32 mm with surrounding calcifications versus surgical clips, possible seroma. Surgical clips RIGHT axilla. IMPRESSION: 1. No acute cardiopulmonary process or CT findings of thoracic trauma. 2. **An incidental finding of potential clinical significance has been found. New RIGHT upper lobe pulmonary nodules measuring to 9 mm. Given history of RIGHT breast cancer, metastatic disease not excluded. Recommend follow-up. Aortic Atherosclerosis (ICD10-I70.0). Electronically Signed   By: Elon Alas M.D.   On: 08/07/2017 15:14   Mr Brain Wo Contrast  Result Date: 08/09/2017 CLINICAL DATA:  67 year old female status post fall in bathroom. Slurred speech and confusion. EXAM: MRI HEAD WITHOUT CONTRAST TECHNIQUE: Multiplanar, multiecho pulse sequences of the brain and surrounding structures were obtained without intravenous contrast. COMPARISON:  Head CT without contrast 08/07/2017, and earlier. FINDINGS: Brain: No restricted diffusion or evidence of acute infarction. Partially empty sella. No midline shift, mass effect, evidence of mass lesion, ventriculomegaly, extra-axial collection or acute intracranial hemorrhage. Cervicomedullary junction and pituitary are within normal limits. Patchy and confluent bilateral cerebral white matter T2 and FLAIR hyperintensity in a nonspecific configuration, moderate for  age. There are several chronic micro hemorrhages scattered in the brain, including the left corona radiata (series 9, image 62), right thalamus, right posterior temporal lobe. No cortical encephalomalacia identified. T2 heterogeneity in the bilateral deep gray matter nuclei appears largely related to perivascular spaces. The brainstem and cerebellum appear normal. Vascular: Major intracranial vascular flow voids are preserved. Skull and upper cervical spine: Nonspecific mildly decreased T1 marrow signal throughout the skull and visible cervical spine. No Upper cervical spinal stenosis. Sinuses/Orbits: Bilateral orbits soft tissues appear normal. Widespread mild to moderate paranasal sinus mucosal thickening, with a small fluid level in the right maxillary sinus. There is diffuse nasal cavity mucosal thickening with a small volume of retained secretions. Furthermore, there is increased soft tissue in the nasopharynx. There appears to be maintained normal adenoid architecture (series 6, image 4). The parapharyngeal spaces appear to remain normal. There is similar mild enlargement of the soft palate. Other: Bilateral mastoid air cells and tympanic cavities remain clear. Visible internal auditory structures appear normal. Visible face and scalp soft tissues are within normal limits. IMPRESSION: 1.  No acute intracranial abnormality. Moderate for age white matter signal changes with scattered chronic micro-hemorrhages in the bare brain most compatible with chronic small vessel disease. 2. Acute Rhinosinusitis. Soft tissue hypertrophy in the nasopharynx most compatible with benign inflammation of the adenoids. Small volume retained secretions in the nasal cavity and nasopharynx. Electronically Signed   By: Genevie Ann M.D.   On: 08/09/2017  07:36   Dg Pelvis Portable  Result Date: 08/08/2017 CLINICAL DATA:  Recent fall. EXAM: PORTABLE PELVIS 1-2 VIEWS COMPARISON:  None. FINDINGS: No acute or healing fractures are present.  The hips project over the acetabula bilaterally. Degenerative changes are noted at the SI joints. IMPRESSION: Negative. Electronically Signed   By: San Morelle M.D.   On: 08/08/2017 10:30   Nm Pulmonary Perf And Vent  Result Date: 08/10/2017 CLINICAL DATA:  Shortness of breath and CHF. Evaluate for pulmonary embolus. EXAM: NUCLEAR MEDICINE VENTILATION - PERFUSION LUNG SCAN TECHNIQUE: Ventilation images were obtained in multiple projections using inhaled aerosol Tc-69m DTPA. Perfusion images were obtained in multiple projections after intravenous injection of Tc-41m-MAA. RADIOPHARMACEUTICALS:  31.9 mCi of Tc-25m DTPA aerosol inhalation and 4.0 mCi Tc29m-MAA IV COMPARISON:  None FINDINGS: Ventilation: No focal ventilation defect. Central airway deposition of the radiopharmaceutical may reflect sequelae of obstructive pulmonary disease. Perfusion: No wedge shaped peripheral perfusion defects to suggest acute pulmonary embolism. IMPRESSION: 1. No evidence for acute pulmonary embolus. Electronically Signed   By: Kerby Moors M.D.   On: 08/10/2017 11:53   Ione Cm  Result Date: 08/10/2017 CLINICAL DATA:  Fever of unknown origin EXAM: CT PARANASAL SINUS LIMITED WITHOUT CONTRAST TECHNIQUE: Non-contiguous multidetector CT images of the paranasal sinuses were obtained in a single plane without contrast. COMPARISON:  Brain MRI from 2 days prior FINDINGS: Generalized mucosal thickening in the paranasal sinuses, greatest in the maxillary and ethmoid sinuses. A right maxillary fluid level seen on comparison brain MRI is resolved. No incidental orbital, soft tissue, or osseous abnormality. IMPRESSION: Generalized moderate sinusitis without progression since brain MRI 2 days prior. A right maxillary fluid level seen on MRI is not visualized today. Electronically Signed   By: Monte Fantasia M.D.   On: 08/10/2017 10:44      Subjective: Complaints feels great.  Discharge Exam: Vitals:    08/12/17 2104 08/13/17 0626  BP: 91/68 (!) 94/57  Pulse: 73 84  Resp: 20 20  Temp: (!) 96 F (35.6 C) 97.8 F (36.6 C)  SpO2: 95% 93%   Vitals:   08/12/17 1700 08/12/17 2104 08/13/17 0522 08/13/17 0626  BP: 112/66 91/68  (!) 94/57  Pulse: 88 73  84  Resp:  20  20  Temp: 98.3 F (36.8 C) (!) 96 F (35.6 C)  97.8 F (36.6 C)  TempSrc: Oral Oral  Oral  SpO2: 98% 95%  93%  Weight:   130.5 kg (287 lb 9.6 oz)   Height:        General: Pt is alert, awake, not in acute distress Cardiovascular: RRR, S1/S2 +, no rubs, no gallops Respiratory: CTA bilaterally, no wheezing, no rhonchi Abdominal: Soft, NT, ND, bowel sounds + Extremities: no edema, no cyanosis    The results of significant diagnostics from this hospitalization (including imaging, microbiology, ancillary and laboratory) are listed below for reference.     Microbiology: Recent Results (from the past 240 hour(s))  Urine culture     Status: None   Collection Time: 08/07/17 12:54 PM  Result Value Ref Range Status   Specimen Description   Final    URINE, CATHETERIZED Performed at [facility], [address] [place],  [address]    Special Requests   Final    Normal Performed at [facility], [address] [place], [place] [address]    Culture   Final    NO GROWTH Performed at [facility], [place] [address] [place], [place] [address]    Report Status 08/08/2017 FINAL  Final  Respiratory Panel by PCR     Status: None   Collection Time: 08/07/17  3:15 PM  Result Value Ref Range Status   Adenovirus NOT DETECTED NOT DETECTED Final   Coronavirus 229E NOT DETECTED NOT DETECTED Final   Coronavirus HKU1 NOT DETECTED NOT DETECTED Final   Coronavirus NL63 NOT DETECTED NOT DETECTED Final   Coronavirus OC43 NOT DETECTED NOT DETECTED Final   Metapneumovirus NOT DETECTED NOT DETECTED Final   Rhinovirus / Enterovirus NOT DETECTED NOT DETECTED Final   Influenza A NOT DETECTED NOT DETECTED  Final   Influenza B NOT DETECTED NOT DETECTED Final   Parainfluenza Virus 1 NOT DETECTED NOT DETECTED Final   Parainfluenza Virus 2 NOT DETECTED NOT DETECTED Final   Parainfluenza Virus 3 NOT DETECTED NOT DETECTED Final   Parainfluenza Virus 4 NOT DETECTED NOT DETECTED Final   Respiratory Syncytial Virus NOT DETECTED NOT DETECTED Final   Bordetella pertussis NOT DETECTED NOT DETECTED Final   Chlamydophila pneumoniae NOT DETECTED NOT DETECTED Final   Mycoplasma pneumoniae NOT DETECTED NOT DETECTED Final    Comment: Performed at [facility], [place] [address], [place], [place] [address]  Culture, blood (Routine X 2) w Reflex to ID Panel     Status: None (Preliminary result)   Collection Time: 08/10/17 11:13 AM  Result Value Ref Range Status   Specimen Description   Final    BLOOD RIGHT HAND Performed at [facility] [address] [place], [place] [address]    Special Requests   Final    BOTTLES DRAWN AEROBIC AND ANAEROBIC Blood Culture adequate volume Performed at [facility] [address], [place], [place] [address]    Culture   Final    NO GROWTH 2 DAYS Performed at [facility] [address], [place], [place] [address]    Report Status PENDING  Incomplete  Culture, blood (Routine X 2) w Reflex to ID Panel     Status: None (Preliminary result)   Collection Time: 08/10/17 11:18 AM  Result Value Ref Range Status   Specimen Description   Final    BLOOD LEFT HAND Performed at [facility] [address] [place], [place] [address]    Special Requests   Final    BOTTLES DRAWN AEROBIC ONLY Blood Culture adequate volume Performed at [facility] [address], [place], [place] [address]    Culture   Final    NO GROWTH 2 DAYS Performed at [facility] [address] [place], [place] [address]    Report Status PENDING  Incomplete     Labs: BNP (last 3 results) Recent Labs     08/07/17 1220  BNP 951.8*   Basic Metabolic Panel: Recent Labs  Lab 08/08/17 0517 08/11/17 0529 08/11/17 1214 08/12/17 0517 08/13/17 0510  NA 140 138 137 139 139  K 4.0 5.6* 4.2 6.0* 3.7  CL 102 99* 98* 102 100*  CO2 27 29 28 26 30   GLUCOSE 142* 116* 124* 135* 108*  BUN 25* 23* 21* 22* 15  CREATININE 1.76* 1.68* 1.54* 1.74* 1.24*  CALCIUM 8.7* 8.5* 8.6* 8.6* 8.3*  MG 1.8  --   --   --   --    Liver Function Tests: Recent Labs  Lab 08/07/17 1331  AST 35  ALT 16  ALKPHOS 65  BILITOT 0.6  PROT 8.1  ALBUMIN 3.6   No results for input(s): LIPASE, AMYLASE in the last 168 hours. No results for input(s): AMMONIA in the last 168 hours. CBC: Recent Labs  Lab 08/07/17 1220 08/08/17 0517 08/11/17 0529  WBC 17.1* 12.8* 10.3  NEUTROABS 12.7*  --  4.1  HGB 11.7* 11.1* 11.2*  HCT 35.3* 35.1* 36.1  MCV 91.9 95.4 96.5  PLT 170 189 213   Cardiac Enzymes: Recent Labs  Lab 08/07/17 1220 08/08/17 0517 08/08/17 0953 08/08/17 1559  TROPONINI 0.05* 0.05* 0.03* 0.03*   BNP: Invalid input(s): POCBNP CBG: Recent Labs  Lab 08/12/17 0715 08/12/17 1136 08/12/17 1639 08/12/17 2110 08/13/17 0751  GLUCAP 115* 127* 128* 199* 109*   D-Dimer No results for input(s): DDIMER in the last 72 hours. Hgb A1c No results for input(s): HGBA1C in the last 72 hours. Lipid Profile No results for input(s): CHOL, HDL, LDLCALC, TRIG, CHOLHDL, LDLDIRECT in the last 72 hours. Thyroid function studies No results for input(s): TSH, T4TOTAL, T3FREE, THYROIDAB in the last 72 hours.  Invalid input(s): FREET3 Anemia work up No results for input(s): VITAMINB12, FOLATE, FERRITIN, TIBC, IRON, RETICCTPCT in the last 72 hours. Urinalysis    Component Value Date/Time   COLORURINE YELLOW 08/07/2017 1254   APPEARANCEUR CLEAR 08/07/2017 1254   LABSPEC >1.030 (H) 08/07/2017 1254   PHURINE 5.5 08/07/2017 1254   GLUCOSEU NEGATIVE 08/07/2017 1254   HGBUR TRACE (A) 08/07/2017 1254   BILIRUBINUR SMALL  (A) 08/07/2017 1254   KETONESUR NEGATIVE 08/07/2017 1254   PROTEINUR 100 (A) 08/07/2017 1254   NITRITE NEGATIVE 08/07/2017 1254   LEUKOCYTESUR NEGATIVE 08/07/2017 1254   Sepsis Labs Invalid input(s): PROCALCITONIN,  WBC,  LACTICIDVEN Microbiology Recent Results (from the past 240 hour(s))  Urine culture     Status: None   Collection Time: 08/07/17 12:54 PM  Result Value Ref Range Status   Specimen Description   Final    URINE, CATHETERIZED Performed at [facility], [address] [place], [place] [address]    Special Requests   Final    Normal Performed at [facility], [address] [place], [place] [address]    Culture   Final    NO GROWTH Performed at [facility], [place] [address] [place], [place] [address]    Report Status 08/08/2017 FINAL  Final  Respiratory Panel by PCR     Status: None   Collection Time: 08/07/17  3:15 PM  Result Value Ref Range Status   Adenovirus NOT DETECTED NOT DETECTED Final   Coronavirus 229E NOT DETECTED NOT DETECTED Final   Coronavirus HKU1 NOT DETECTED NOT DETECTED Final   Coronavirus NL63 NOT DETECTED NOT DETECTED Final   Coronavirus OC43 NOT DETECTED NOT DETECTED Final   Metapneumovirus NOT DETECTED NOT DETECTED Final   Rhinovirus / Enterovirus NOT DETECTED NOT DETECTED Final   Influenza A NOT DETECTED NOT DETECTED Final   Influenza B NOT DETECTED NOT DETECTED Final   Parainfluenza Virus 1 NOT DETECTED NOT DETECTED Final   Parainfluenza Virus 2 NOT DETECTED NOT DETECTED Final   Parainfluenza Virus 3 NOT DETECTED NOT DETECTED Final   Parainfluenza Virus 4 NOT DETECTED NOT DETECTED Final   Respiratory Syncytial Virus NOT DETECTED NOT DETECTED Final   Bordetella pertussis NOT DETECTED NOT DETECTED Final   Chlamydophila pneumoniae NOT DETECTED NOT DETECTED Final   Mycoplasma pneumoniae NOT DETECTED NOT DETECTED Final    Comment: Performed at [facility], [address], [place], [place] [address]   Culture, blood (Routine X 2) w Reflex to ID Panel     Status: None (Preliminary result)   Collection Time: 08/10/17 11:13 AM  Result Value Ref Range Status   Specimen Description   Final    BLOOD RIGHT HAND Performed at [facility] [address], [place], [place] [address]    Special Requests   Final    BOTTLES DRAWN AEROBIC AND ANAEROBIC Blood Culture adequate volume Performed at [facility] [address] [place], [place] [address]    Culture   Final    NO GROWTH 2 DAYS Performed at [facility] [address], [place], [place] [address]    Report Status PENDING  Incomplete  Culture, blood (Routine X 2) w Reflex to ID Panel     Status: None (Preliminary result)   Collection Time: 08/10/17 11:18 AM  Result Value Ref Range Status   Specimen Description   Final    BLOOD LEFT HAND Performed at [facility] [address] [place], [place] [address]    Special Requests   Final    BOTTLES DRAWN AEROBIC ONLY Blood Culture adequate volume Performed at [facility] [address], [place], [place] [address]    Culture   Final    NO GROWTH 2 DAYS Performed at [facility] [address], [place], [place] [address]    Report Status PENDING  Incomplete     Time coordinating discharge: Over 30 minutes  SIGNED:   Charlynne Cousins, MD  [facility] 08/13/2017, 9:30 AM Pager   If 7PM-7AM, please contact night-coverage [email or web address] Password [email or web address]

## 2017-08-13 NOTE — Care Management Note (Signed)
Case Management Note  Patient Details  Name: Ayleen Mckinstry MRN: [id number] Date of Birth: 18-Oct-1950  Subjective/Objective:   PT recc HHPT. [facility] rep [facility] Glad aware of d/c & HHC orders. No further CM needs.                 Action/Plan:d/c home w/HHC.   Expected Discharge Date:  08/13/17               Expected Discharge Plan:  [place]  In-House Referral:     Discharge planning Services  CM Consult  Post Acute Care Choice:  Durable Medical Equipment(rw) Choice offered to:  Patient  DME Arranged:    DME Agency:     HH Arranged:  PT [place]:  [place]  Status of Service:  Completed, signed off  If discussed at [place] of Stay Meetings, dates discussed:    Additional Comments:  Dessa Phi, RN 08/13/2017, 9:48 AM

## 2017-08-15 LAB — CULTURE, BLOOD (ROUTINE X 2)
CULTURE: NO GROWTH
CULTURE: NO GROWTH
SPECIAL REQUESTS: ADEQUATE
Special Requests: ADEQUATE

## 2017-08-31 ENCOUNTER — Other Ambulatory Visit: Payer: Self-pay | Admitting: Family Medicine

## 2017-08-31 DIAGNOSIS — Z1231 Encounter for screening mammogram for malignant neoplasm of breast: Secondary | ICD-10-CM

## 2017-09-27 ENCOUNTER — Ambulatory Visit: Payer: Medicare Other

## 2017-12-12 ENCOUNTER — Ambulatory Visit
Admission: RE | Admit: 2017-12-12 | Discharge: 2017-12-12 | Disposition: A | Discharge status: Home / Self Care | Payer: Medicare Other | Source: Ambulatory Visit | Attending: Family Medicine | Admitting: Family Medicine

## 2017-12-12 DIAGNOSIS — Z1231 Encounter for screening mammogram for malignant neoplasm of breast: Secondary | ICD-10-CM

## 2017-12-12 HISTORY — DX: Personal history of irradiation: Z92.3

## 2017-12-12 HISTORY — DX: Malignant neoplasm of unspecified site of unspecified female breast: C50.919

## 2017-12-12 HISTORY — DX: Personal history of antineoplastic chemotherapy: Z92.21

## 2018-11-18 ENCOUNTER — Other Ambulatory Visit: Payer: Self-pay | Admitting: Family Medicine

## 2018-11-18 DIAGNOSIS — Z1231 Encounter for screening mammogram for malignant neoplasm of breast: Secondary | ICD-10-CM

## 2019-01-14 ENCOUNTER — Ambulatory Visit: Payer: Medicare Other

## 2019-02-04 ENCOUNTER — Ambulatory Visit
Admission: RE | Admit: 2019-02-04 | Discharge: 2019-02-04 | Disposition: A | Discharge status: Home / Self Care | Payer: Medicare Other | Source: Ambulatory Visit | Attending: Family Medicine | Admitting: Family Medicine

## 2019-02-04 ENCOUNTER — Other Ambulatory Visit: Payer: Self-pay

## 2019-02-04 DIAGNOSIS — Z1231 Encounter for screening mammogram for malignant neoplasm of breast: Secondary | ICD-10-CM

## 2019-05-28 IMAGING — CT CT HEAD W/O CM
3 series · 14 of 47 positions shown, 16 images · non-contrast
Comparison: August 06, 2017.

CLINICAL DATA: Pain following fall.  History of breast carcinoma

EXAM:
CT HEAD WITHOUT CONTRAST
TECHNIQUE: Contiguous axial images were obtained from the base of the skull
through the vertex without intravenous contrast.

[Series 2: head wo · axial · 0.39mm/px · z∈[-173,-48]mm · 8 of 31 slices shown, 10 images]
[im 3/31  brain]
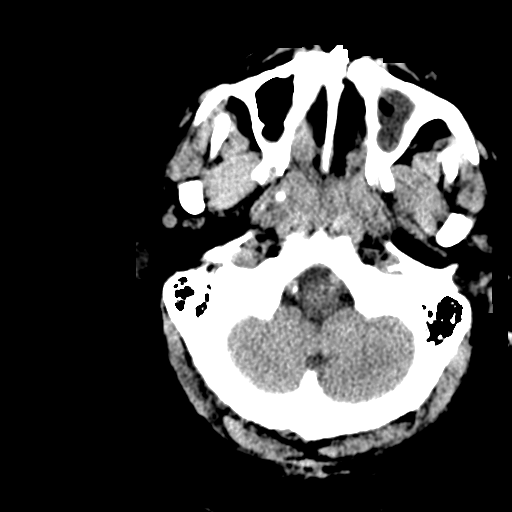
[im 3/31  bone]
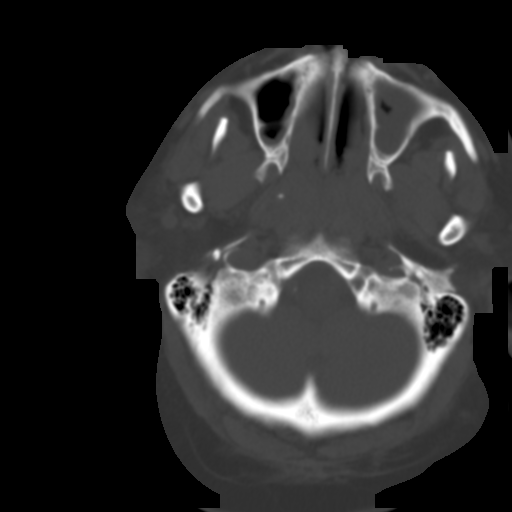
[im 7/31  brain]
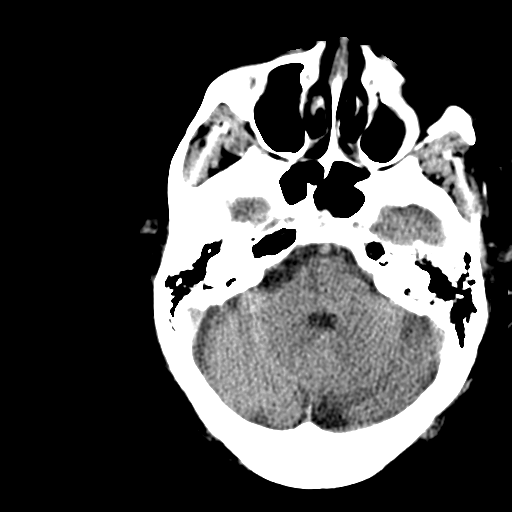
[im 10/31  brain]
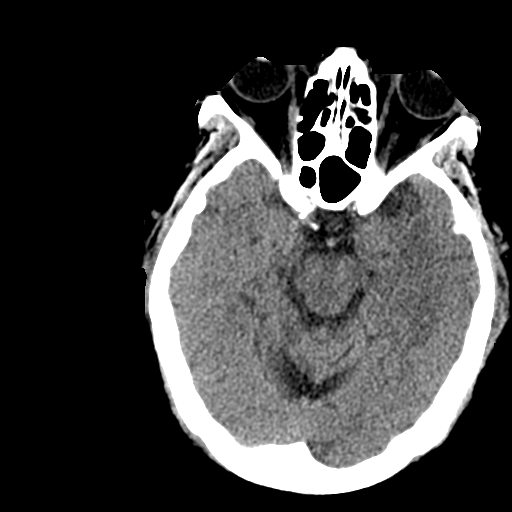
[im 14/31  brain]
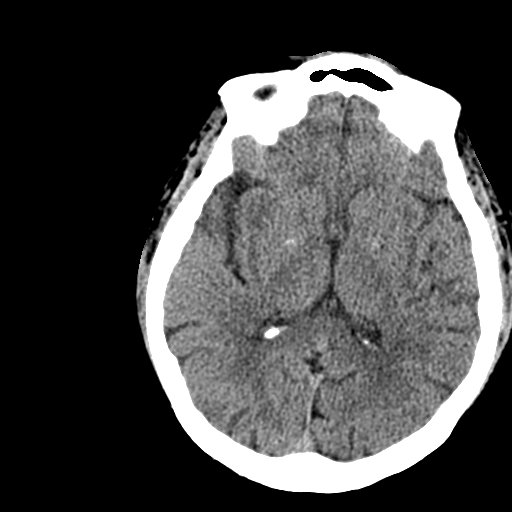
[im 17/31  brain]
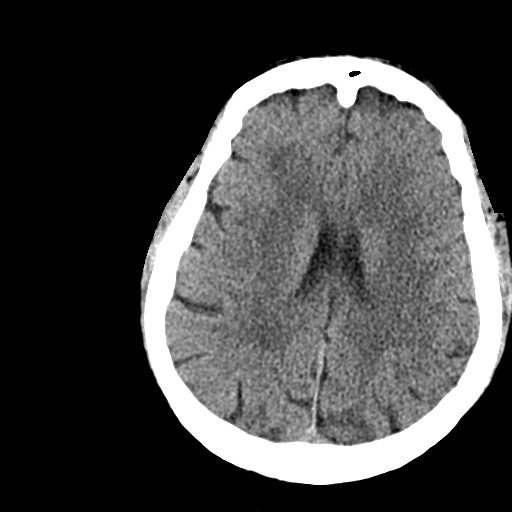
[im 17/31  bone]
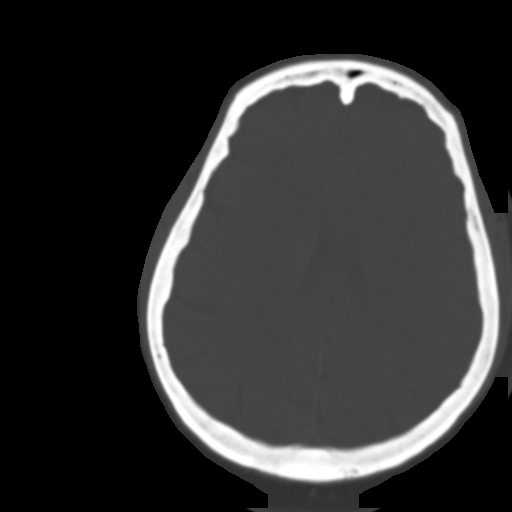
[im 21/31  brain]
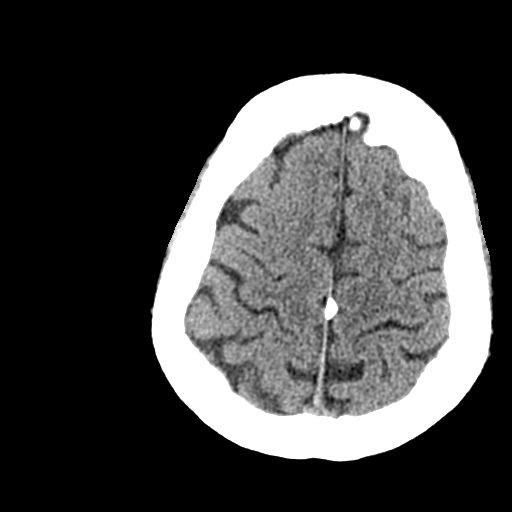
[im 24/31  brain]
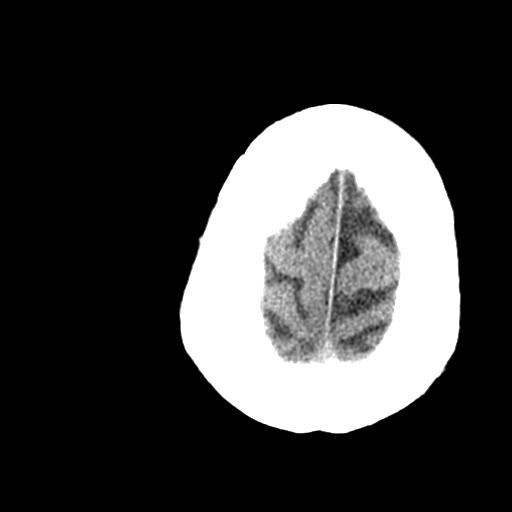
[im 28/31  brain]
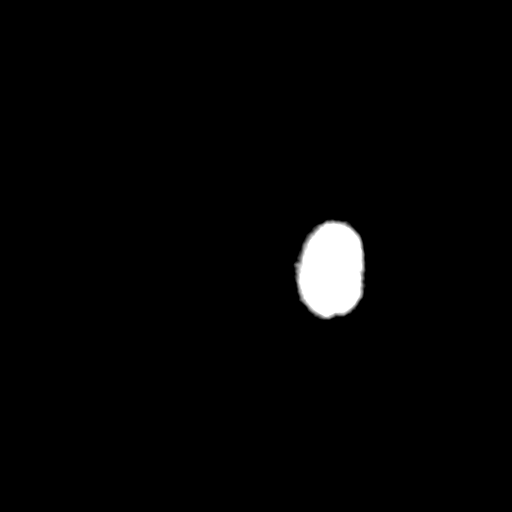

[Series 4: cor soft · coronal · 0.30mm/px · 3 of 70 slices shown]
[im 24/70  brain]
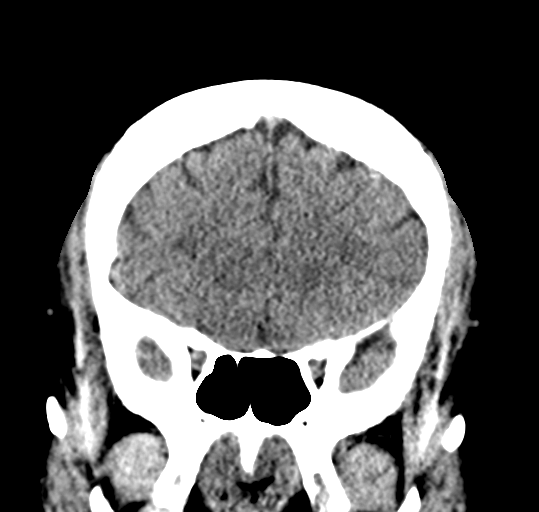
[im 31/70  brain]
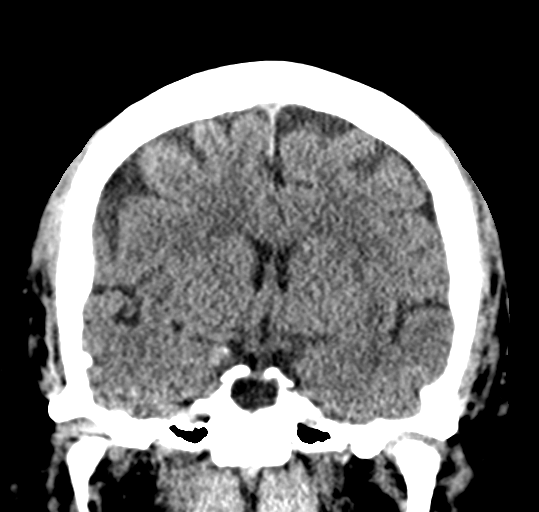
[im 39/70  brain]
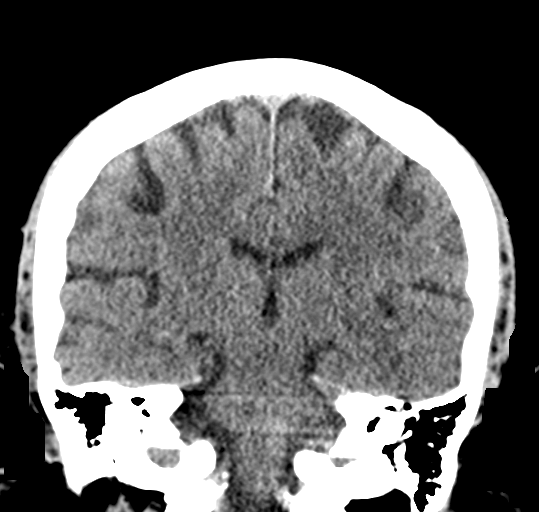

[Series 5: sag soft · sagittal · 0.28mm/px · 3 of 61 slices shown]
[im 21/61  brain]
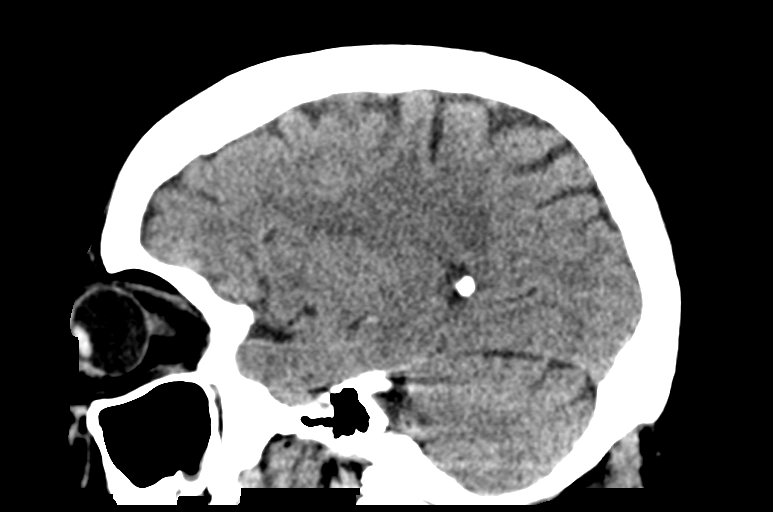
[im 31/61  brain]
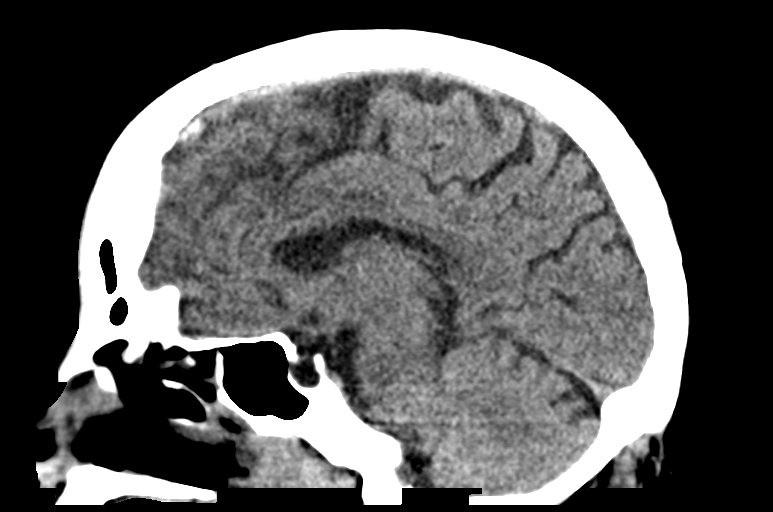
[im 41/61  brain]
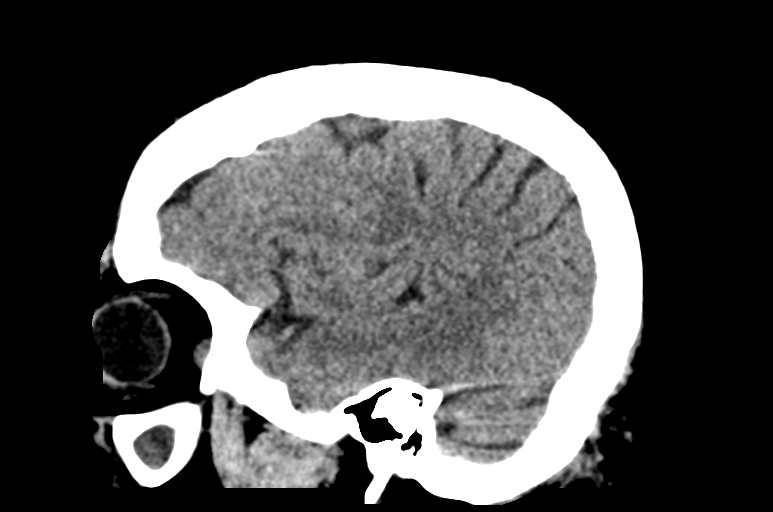

[14 of 47 positions shown; findings below may reference images not displayed]

FINDINGS: Brain: The ventricles are normal in size and configuration. There is
no intracranial mass, hemorrhage, extra-axial fluid collection, or
midline shift. Patchy small vessel disease in the centra semiovale
bilaterally is stable. There is no new gray-white compartment lesion
evident. No acute infarct appreciable. Mild basal ganglia
calcification is felt to be physiologic in this age group.

Vascular: No hyperdense vessel. There is calcification in each
carotid siphon region.

Skull: The bony calvarium appears intact.

Sinuses/Orbits: There is opacification of most of the left maxillary
antrum. There is mucosal thickening to a much lesser extent in the
right maxillary antrum. There is opacification of several ethmoid
air cells bilaterally. There is mild patchy opacity in the right
sphenoid sinus with mucosal thickening in both anterior sphenoid
regions. Orbits appear symmetric bilaterally.

Other: Mastoid air cells are clear.
IMPRESSION: Patchy supratentorial small vessel disease. No acute infarct
evident. No mass or hemorrhage.

Foci of arteriovascular calcification noted. There are multiple foci
paranasal sinus disease, stable from 1 day prior.

## 2019-05-28 IMAGING — CR DG CHEST 2V
2 series · 2 of 2 positions shown · non-contrast
Comparison: 02/15/2015

CLINICAL DATA: Recent fall. Chest pain. Cough and chest congestion.

EXAM:
CHEST - 2 VIEW

[w chest pa]
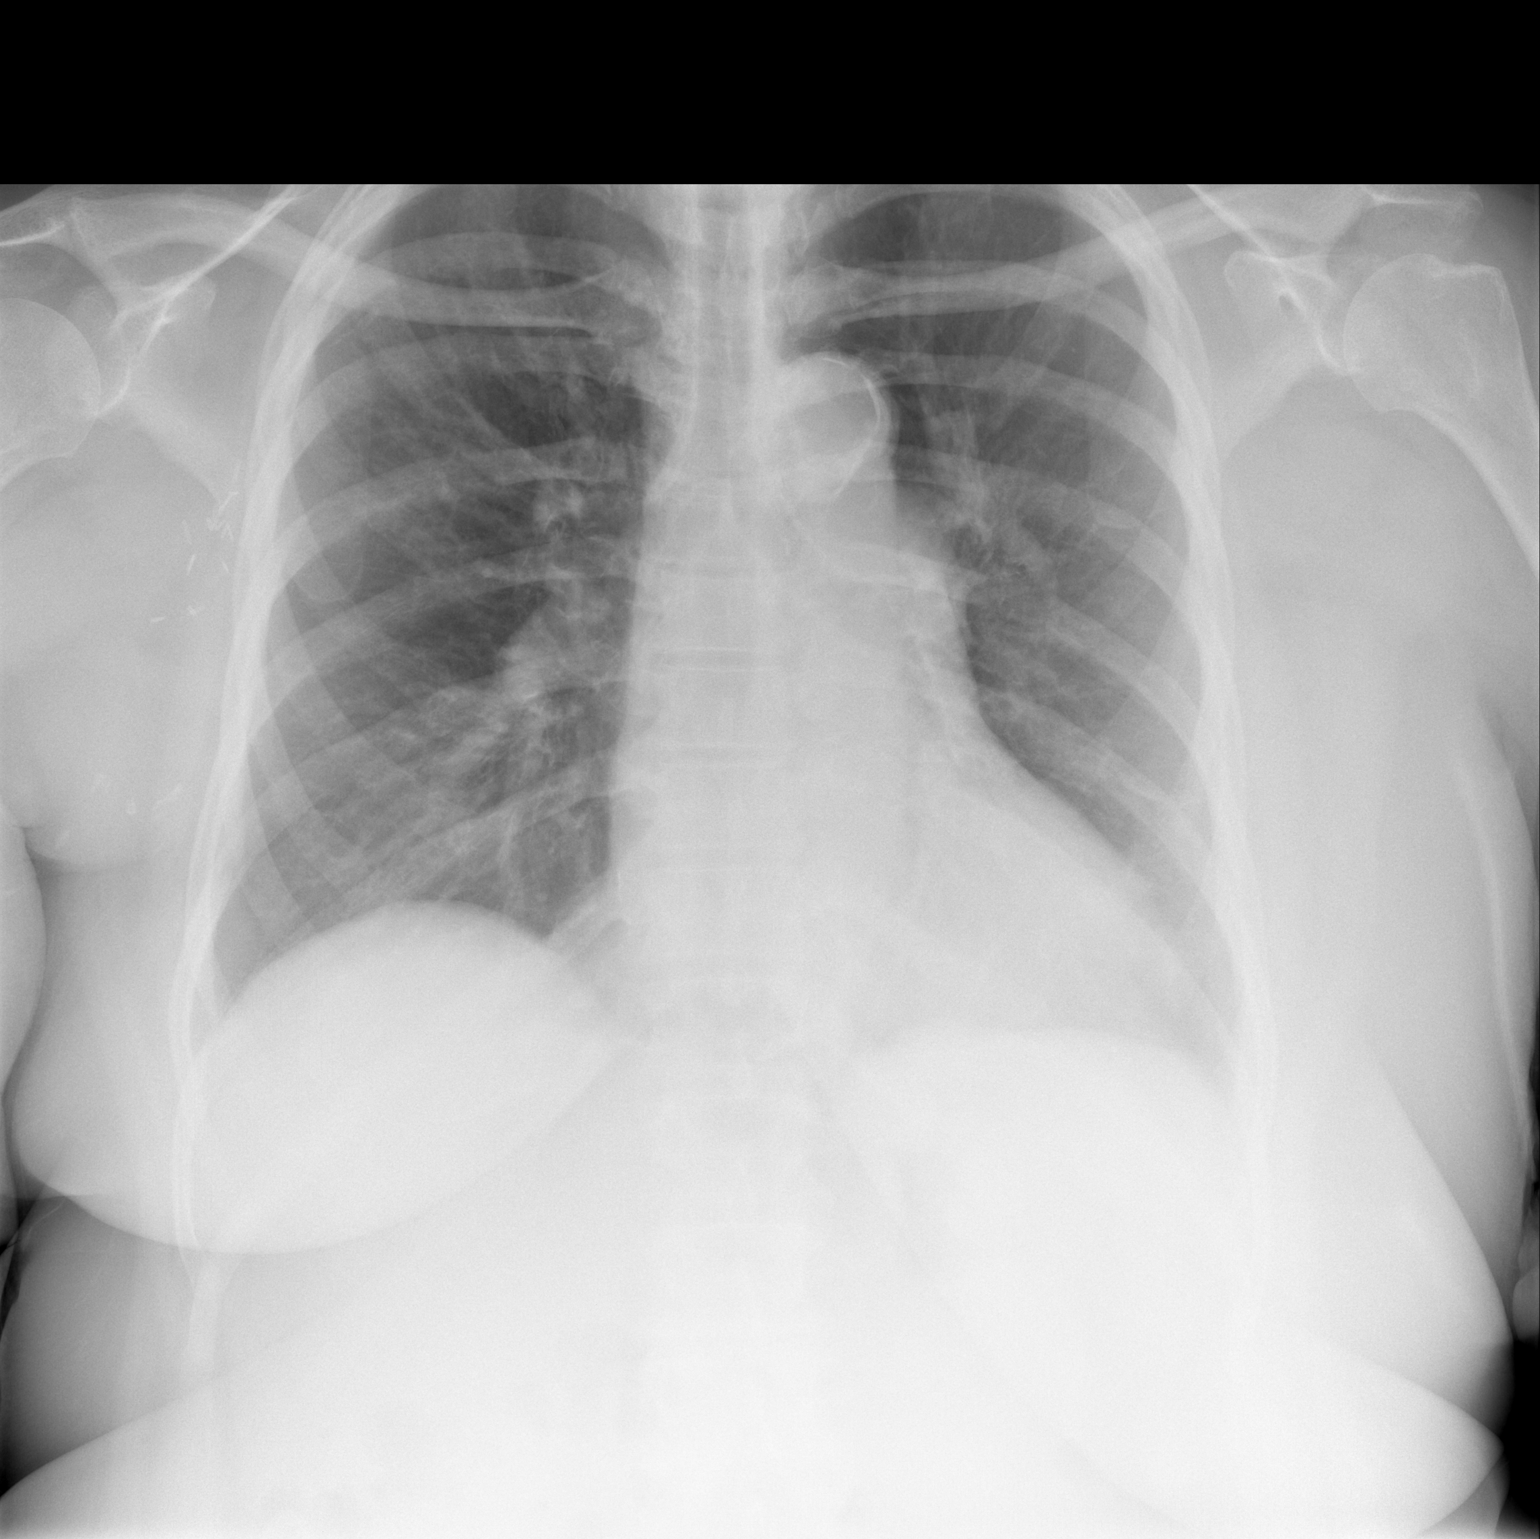

[w chest lat]
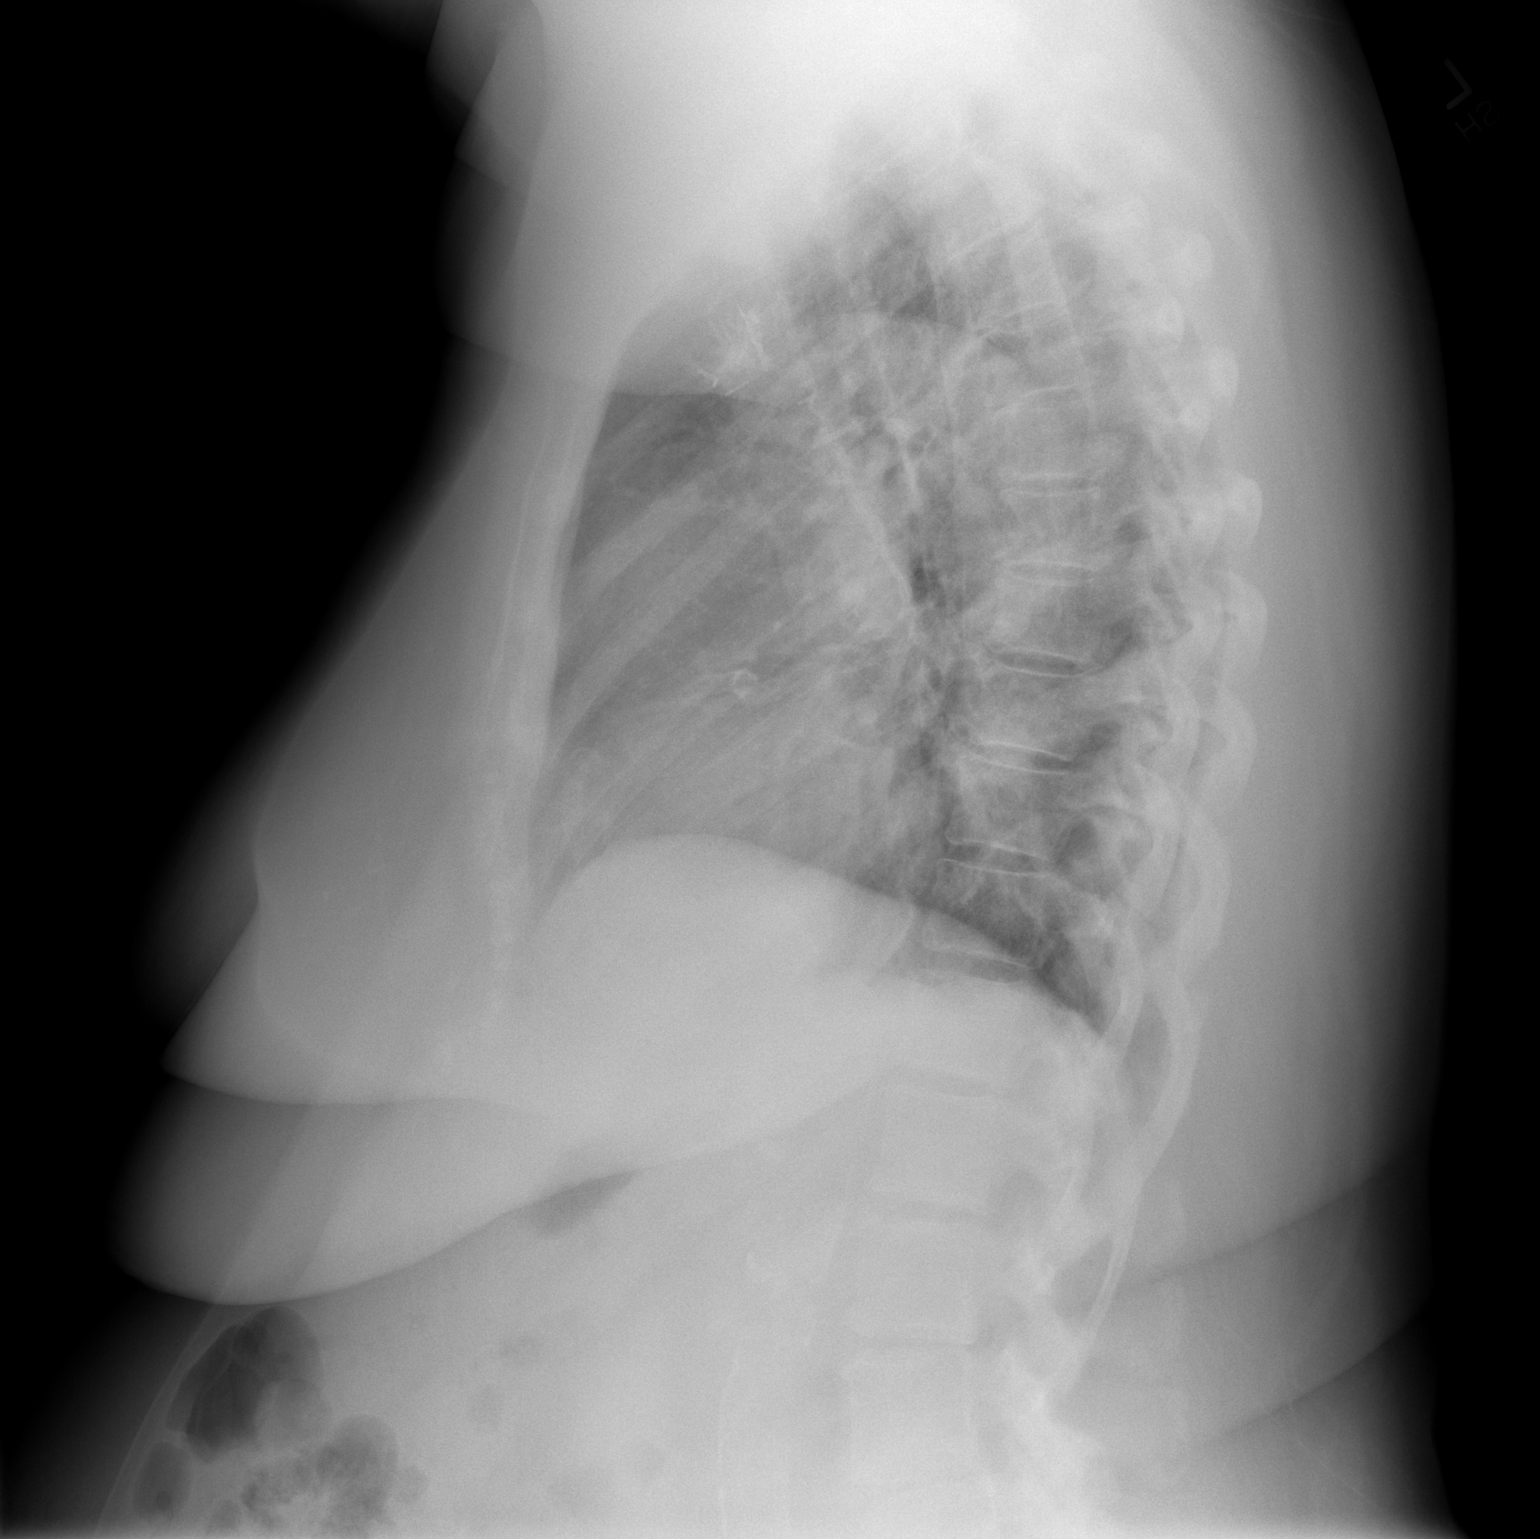

[2 of 2 positions shown; findings below may reference images not displayed]

FINDINGS: The heart size and mediastinal contours are within normal limits.
Aortic atherosclerosis. Both lungs are clear. Surgical clips again
seen in right axilla. The visualized skeletal structures are
unremarkable.
IMPRESSION: No active cardiopulmonary disease.

## 2020-08-19 ENCOUNTER — Other Ambulatory Visit: Payer: Self-pay | Admitting: Family Medicine

## 2020-08-19 DIAGNOSIS — Z1231 Encounter for screening mammogram for malignant neoplasm of breast: Secondary | ICD-10-CM

## 2020-10-12 ENCOUNTER — Ambulatory Visit
Admission: RE | Admit: 2020-10-12 | Discharge: 2020-10-12 | Disposition: A | Discharge status: Home / Self Care | Payer: Medicare Other | Source: Ambulatory Visit | Attending: Family Medicine | Admitting: Family Medicine

## 2020-10-12 ENCOUNTER — Other Ambulatory Visit: Payer: Self-pay

## 2020-10-12 DIAGNOSIS — Z1231 Encounter for screening mammogram for malignant neoplasm of breast: Secondary | ICD-10-CM

## 2021-07-09 ENCOUNTER — Emergency Department (HOSPITAL_BASED_OUTPATIENT_CLINIC_OR_DEPARTMENT_OTHER)
Admission: EM | Admit: 2021-07-09 | Discharge: 2021-07-09 | Disposition: A | Discharge status: Home / Self Care | Payer: Medicare Other | Attending: Emergency Medicine | Admitting: Emergency Medicine

## 2021-07-09 ENCOUNTER — Other Ambulatory Visit: Payer: Self-pay

## 2021-07-09 ENCOUNTER — Encounter (HOSPITAL_BASED_OUTPATIENT_CLINIC_OR_DEPARTMENT_OTHER): Payer: Self-pay | Admitting: Emergency Medicine

## 2021-07-09 DIAGNOSIS — I129 Hypertensive chronic kidney disease with stage 1 through stage 4 chronic kidney disease, or unspecified chronic kidney disease: Secondary | ICD-10-CM | POA: Diagnosis not present

## 2021-07-09 DIAGNOSIS — Z853 Personal history of malignant neoplasm of breast: Secondary | ICD-10-CM | POA: Diagnosis not present

## 2021-07-09 DIAGNOSIS — R748 Abnormal levels of other serum enzymes: Secondary | ICD-10-CM | POA: Diagnosis not present

## 2021-07-09 DIAGNOSIS — I10 Essential (primary) hypertension: Secondary | ICD-10-CM

## 2021-07-09 DIAGNOSIS — N189 Chronic kidney disease, unspecified: Secondary | ICD-10-CM | POA: Diagnosis not present

## 2021-07-09 DIAGNOSIS — Z79899 Other long term (current) drug therapy: Secondary | ICD-10-CM | POA: Diagnosis not present

## 2021-07-09 LAB — CBC WITH DIFFERENTIAL/PLATELET
Abs Immature Granulocytes: 0.01 10*3/uL (ref 0.00–0.07)
Basophils Absolute: 0 10*3/uL (ref 0.0–0.1)
Basophils Relative: 0 %
Eosinophils Absolute: 0.1 10*3/uL (ref 0.0–0.5)
Eosinophils Relative: 2 %
HCT: 34 % — ABNORMAL LOW (ref 36.0–46.0)
Hemoglobin: 11.3 g/dL — ABNORMAL LOW (ref 12.0–15.0)
Immature Granulocytes: 0 %
Lymphocytes Relative: 46 %
Lymphs Abs: 3.2 10*3/uL (ref 0.7–4.0)
MCH: 31.3 pg (ref 26.0–34.0)
MCHC: 33.2 g/dL (ref 30.0–36.0)
MCV: 94.2 fL (ref 80.0–100.0)
Monocytes Absolute: 0.6 10*3/uL (ref 0.1–1.0)
Monocytes Relative: 8 %
Neutro Abs: 3 10*3/uL (ref 1.7–7.7)
Neutrophils Relative %: 44 %
Platelets: 183 10*3/uL (ref 150–400)
RBC: 3.61 MIL/uL — ABNORMAL LOW (ref 3.87–5.11)
RDW: 14.5 % (ref 11.5–15.5)
WBC: 6.8 10*3/uL (ref 4.0–10.5)
nRBC: 0 % (ref 0.0–0.2)

## 2021-07-09 LAB — BASIC METABOLIC PANEL
Anion gap: 9 (ref 5–15)
BUN: 19 mg/dL (ref 8–23)
CO2: 24 mmol/L (ref 22–32)
Calcium: 9 mg/dL (ref 8.9–10.3)
Chloride: 103 mmol/L (ref 98–111)
Creatinine, Ser: 1.67 mg/dL — ABNORMAL HIGH (ref 0.44–1.00)
GFR, Estimated: 33 mL/min — ABNORMAL LOW (ref >60)
Glucose, Bld: 98 mg/dL (ref 70–99)
Potassium: 3.7 mmol/L (ref 3.5–5.1)
Sodium: 136 mmol/L (ref 135–145)

## 2021-07-09 LAB — BRAIN NATRIURETIC PEPTIDE: B Natriuretic Peptide: 797.5 pg/mL — ABNORMAL HIGH (ref 0.0–100.0)

## 2021-07-09 LAB — TROPONIN I (HIGH SENSITIVITY): Troponin I (High Sensitivity): 10 ng/L (ref <18)

## 2021-07-09 MED ORDER — LABETALOL HCL 5 MG/ML IV SOLN
20.0000 mg | Freq: Once | INTRAVENOUS | Status: AC
Start: 2021-07-09 — End: 2021-07-09
  Administered 2021-07-09: 10 mg via INTRAVENOUS
  Filled 2021-07-09: qty 4

## 2021-07-09 NOTE — ED Provider Notes (Signed)
[facility] Provider Note   CSN: [id number] Arrival date & time: 07/09/21  1204     History  Chief Complaint  Patient presents with   Hypertension    Tonya Moon is a 71 y.o. female.  History of CKD, NSTEMI, hypertension, breast cancer.  Complaining of elevated blood pressure.  She said she has been to a few doctors visits and they noted her blood pressure to be high.  No one has made any adjustment of her medications.  She does not endorse any symptoms though.  She states he has been taking her regular medications, does not eat a high salt diet.  Does not drink or smoke.  Denies any headache blurry vision double vision chest pain shortness of breath nausea vomiting diarrhea or urinary symptoms.  No numbness or weakness.  The history is provided by the patient and the spouse.  Hypertension This is a chronic problem. Episode onset: 2 months. The problem occurs constantly. The problem has not changed since onset.Pertinent negatives include no chest pain, no abdominal pain, no headaches and no shortness of breath. Nothing aggravates the symptoms. Nothing relieves the symptoms. She has tried nothing for the symptoms. The treatment provided no relief.      Home Medications Prior to Admission medications   Medication Sig Start Date End Date Taking? Authorizing Provider  Albuterol Sulfate (PROAIR HFA IN) Inhale 1 puff into the lungs every 8 (eight) hours as needed.    [provider]  amLODipine (NORVASC) 10 MG tablet Take 10 mg by mouth daily.    [provider]  Aromatic Inhalants (VICKS VAPOR INHALER IN) Inhale into the lungs as needed.     [provider]  benazepril-hydrochlorthiazide (LOTENSIN HCT) 10-12.5 MG tablet Take 1 tablet by mouth daily.    [provider]  furosemide (LASIX) 20 MG tablet Take 10 mg by mouth daily.     [provider]  gabapentin (NEURONTIN) 300 MG capsule Take 300 mg by mouth  daily. 05/18/17   [provider]  lisinopril (PRINIVIL,ZESTRIL) 20 MG tablet Take 10 mg by mouth daily.  07/20/17   [provider]  magnesium oxide (MAG-OX) 400 MG tablet Take 400 mg by mouth daily.    [provider]  metoprolol (TOPROL-XL) 200 MG 24 hr tablet Take 200 mg by mouth daily. Take with or immediately following a meal.     [provider]  rosuvastatin (CRESTOR) 5 MG tablet Take 5 mg by mouth every evening. 07/05/17   [provider]  TRADJENTA 5 MG TABS tablet Take 5 mg by mouth daily. 07/09/17   [provider]  ULORIC 40 MG tablet Take 40 mg by mouth daily. 07/22/17   [provider]  Vitamin D, Ergocalciferol, (DRISDOL) 50000 units CAPS capsule Take 50,000 Units by mouth every 7 (seven) days.    [provider]      Allergies    Patient has no known allergies.    Review of Systems   Review of Systems  Constitutional:  Negative for fever.  HENT:  Negative for sore throat.   Eyes:  Negative for visual disturbance.  Respiratory:  Negative for shortness of breath.   Cardiovascular:  Negative for chest pain.  Gastrointestinal:  Negative for abdominal pain.  Genitourinary:  Negative for dysuria.  Musculoskeletal:  Negative for neck pain.  Skin:  Negative for rash.  Neurological:  Negative for headaches.   Physical Exam Updated Vital Signs BP Marland Kitchen)  193/118 (BP Location: Left Arm)    Pulse 81    Temp 97.6 F (36.4 C) (Oral)    Resp 18    Ht 5\' 6"  (1.676 m)    Wt 107 kg    SpO2 99%    BMI 38.09 kg/m  Physical Exam Vitals and nursing note reviewed.  Constitutional:      General: She is not in acute distress.    Appearance: Normal appearance. She is well-developed.  HENT:     Head: Normocephalic and atraumatic.  Eyes:     Conjunctiva/sclera: Conjunctivae normal.  Cardiovascular:     Rate and Rhythm: Normal rate and regular rhythm.     Heart sounds: Murmur heard.  Pulmonary:     Effort: Pulmonary effort  is normal. No respiratory distress.     Breath sounds: Normal breath sounds.  Abdominal:     Palpations: Abdomen is soft.     Tenderness: There is no abdominal tenderness. There is no guarding or rebound.  Musculoskeletal:        General: No swelling.     Cervical back: Neck supple.  Skin:    General: Skin is warm and dry.     Capillary Refill: Capillary refill takes less than 2 seconds.  Neurological:     General: No focal deficit present.     Mental Status: She is alert.     Sensory: No sensory deficit.     Motor: No weakness.     Gait: Gait normal.    ED Results / Procedures / Treatments   Labs (all labs ordered are listed, but only abnormal results are displayed) Labs Reviewed  BASIC METABOLIC PANEL - Abnormal; Notable for the following components:      Result Value   Creatinine, Ser 1.67 (*)    GFR, Estimated 33 (*)    All other components within normal limits  CBC WITH DIFFERENTIAL/PLATELET - Abnormal; Notable for the following components:   RBC 3.61 (*)    Hemoglobin 11.3 (*)    HCT 34.0 (*)    All other components within normal limits  BRAIN NATRIURETIC PEPTIDE - Abnormal; Notable for the following components:   B Natriuretic Peptide 797.5 (*)    All other components within normal limits  TROPONIN I (HIGH SENSITIVITY)  TROPONIN I (HIGH SENSITIVITY)    EKG EKG Interpretation  Date/Time:  Saturday July 09 2021 12:17:56 EST Ventricular Rate:  67 PR Interval:  148 QRS Duration: 80 QT Interval:  390 QTC Calculation: 412 R Axis:   -8 Text Interpretation: Normal sinus rhythm Septal infarct , age undetermined T wave abnormality, consider inferolateral ischemia Abnormal ECG When compared with ECG of 12-Aug-2017 05:00, new t wave inversions anterior and inferior Confirmed by Aletta Edouard [id number]) on 07/09/2021 12:21:20 PM  Radiology No results found.  Procedures Procedures    Medications Ordered in ED Medications  labetalol (NORMODYNE) injection 20 mg  (10 mg Intravenous Given 07/09/21 1325)    ED Course/ Medical Decision Making/ A&P Clinical Course as of 07/09/21 1748  Sat Jul 09, 2021  1413 BNP a little low but no evidence of fluid overload and breathing comfortably laying flat.  We will have continue her regular medications and follow-up with PCP.  Has a history of aortic stenosis. [MB]    Clinical Course User Index [MB] Hayden Rasmussen, MD                           Medical  Decision Making Amount and/or Complexity of Data Reviewed Labs: ordered.  Risk Prescription drug management.  This patient complains of elevated blood pressure without any other symptoms; this involves an extensive number of treatment Options and is a complaint that carries with it a high risk of complications and Morbidity. The differential includes essential hypertension, renal failure, fluid overload, excess salt intake  I ordered, reviewed and interpreted labs, which included CBC with normal white count, hemoglobin priors, chemistries with mild elevation of creatinine has been seen before, troponin unremarkable, BNP elevated from priors no clinical signs of acute CHF I ordered medication IV labetalol with improvement in her blood pressure  Additional history obtained from patient's husband Previous records obtained and reviewed in epic no recent ED admissions  After the interventions stated above, I reevaluated the patient and found patient's blood pressure to be improved.  Did become slightly bradycardic after labetalol.  Asymptomatic.  Reviewed results of work-up with her.  Recommended close follow-up with her treating provider.  No indications for admission at this time.  Return instructions discussed          Final Clinical Impression(s) / ED Diagnoses Final diagnoses:  Primary hypertension    Rx / DC Orders ED Discharge Orders     None         Hayden Rasmussen, MD 07/09/21 1751

## 2021-07-09 NOTE — ED Triage Notes (Addendum)
Pt reports elevated BP x 3-4 mo; sts she is taking meds as directed; saw a new PCP(?) yesterday and was told they would prescribe new meds, but pt did not receive a prescription

## 2021-07-09 NOTE — Discharge Instructions (Signed)
You are seen in the emergency department for elevated blood pressure.  Your lab work did not show any significant abnormalities.  Please continue your regular medications and follow-up with your primary care doctor.  Return if any concerning symptoms.

## 2021-07-28 ENCOUNTER — Encounter (HOSPITAL_BASED_OUTPATIENT_CLINIC_OR_DEPARTMENT_OTHER): Payer: Self-pay | Admitting: *Deleted

## 2021-07-28 ENCOUNTER — Emergency Department (HOSPITAL_BASED_OUTPATIENT_CLINIC_OR_DEPARTMENT_OTHER)
Admission: EM | Admit: 2021-07-28 | Discharge: 2021-07-28 | Discharge status: Against Medical Advice | Payer: Medicare Other | Attending: Emergency Medicine | Admitting: Emergency Medicine

## 2021-07-28 ENCOUNTER — Other Ambulatory Visit: Payer: Self-pay

## 2021-07-28 DIAGNOSIS — Z79899 Other long term (current) drug therapy: Secondary | ICD-10-CM | POA: Insufficient documentation

## 2021-07-28 DIAGNOSIS — I129 Hypertensive chronic kidney disease with stage 1 through stage 4 chronic kidney disease, or unspecified chronic kidney disease: Secondary | ICD-10-CM | POA: Diagnosis not present

## 2021-07-28 DIAGNOSIS — N189 Chronic kidney disease, unspecified: Secondary | ICD-10-CM | POA: Insufficient documentation

## 2021-07-28 DIAGNOSIS — E1122 Type 2 diabetes mellitus with diabetic chronic kidney disease: Secondary | ICD-10-CM | POA: Diagnosis not present

## 2021-07-28 DIAGNOSIS — I10 Essential (primary) hypertension: Secondary | ICD-10-CM

## 2021-07-28 NOTE — ED Provider Notes (Signed)
?[facility] ?Provider Note ? ? ?CSN: [id number] ?Arrival date & time: 07/28/21  1501 ? ?  ? ?History ? ?Chief Complaint  ?Patient presents with  ? Hypertension  ? ? ?Tonya Moon is a 71 y.o. female with a past medical history of hypertension, diabetes, CKD presenting to the ED for hypertension.  Apparently she was sent from PCPs office today for facial asymmetry.  Patient states that she feels at her baseline.  She states that she is not having any pain.  She denies any chest pain, shortness of breath, headache, blurry vision, numbness in arms or legs, changes to gait, vomiting or diarrhea.  She is stating that she would like to leave because she is "feeling fine." ? ? ?Hypertension ?Pertinent negatives include no chest pain, no abdominal pain and no shortness of breath.  ? ?  ? ?Home Medications ?Prior to Admission medications   ?Medication Sig Start Date End Date Taking? Authorizing Provider  ?Albuterol Sulfate (PROAIR HFA IN) Inhale 1 puff into the lungs every 8 (eight) hours as needed.    [provider]  ?amLODipine (NORVASC) 10 MG tablet Take 10 mg by mouth daily.    [provider]  ?Aromatic Inhalants (VICKS VAPOR INHALER IN) Inhale into the lungs as needed.     [provider]  ?benazepril-hydrochlorthiazide (LOTENSIN HCT) 10-12.5 MG tablet Take 1 tablet by mouth daily.    [provider]  ?furosemide (LASIX) 20 MG tablet Take 10 mg by mouth daily.     [provider]  ?gabapentin (NEURONTIN) 300 MG capsule Take 300 mg by mouth daily. 05/18/17   [provider]  ?lisinopril (PRINIVIL,ZESTRIL) 20 MG tablet Take 10 mg by mouth daily.  07/20/17   [provider]  ?magnesium oxide (MAG-OX) 400 MG tablet Take 400 mg by mouth daily.    [provider]  ?metoprolol (TOPROL-XL) 200 MG 24 hr tablet Take 200 mg by mouth daily. Take with or immediately following a meal.     [provider]  ?rosuvastatin  (CRESTOR) 5 MG tablet Take 5 mg by mouth every evening. 07/05/17   [provider]  ?TRADJENTA 5 MG TABS tablet Take 5 mg by mouth daily. 07/09/17   [provider]  ?ULORIC 40 MG tablet Take 40 mg by mouth daily. 07/22/17   [provider]  ?Vitamin D, Ergocalciferol, (DRISDOL) 50000 units CAPS capsule Take 50,000 Units by mouth every 7 (seven) days.    [provider]  ?   ? ?Allergies    ?Patient has no known allergies.   ? ?Review of Systems   ?Review of Systems  ?Constitutional:  Negative for appetite change, chills and fever.  ?HENT:  Negative for ear pain, rhinorrhea, sneezing and sore throat.   ?Eyes:  Negative for photophobia and visual disturbance.  ?Respiratory:  Negative for cough, chest tightness, shortness of breath and wheezing.   ?Cardiovascular:  Negative for chest pain and palpitations.  ?Gastrointestinal:  Negative for abdominal pain, blood in stool, constipation, diarrhea, nausea and vomiting.  ?Genitourinary:  Negative for dysuria, hematuria and urgency.  ?Musculoskeletal:  Negative for myalgias.  ?Skin:  Negative for rash.  ?Neurological:  Negative for dizziness, weakness and light-headedness.  ? ?Physical Exam ?Updated Vital Signs ?BP (!) 183/110 (BP Location: Right Arm)   Pulse 83   Temp 98.2 ?F (36.8 ?C) (Oral)   Resp 18   Ht 5\' 6"  (1.676 m)   Wt 107 kg  SpO2 98%   BMI 38.07 kg/m?  ?Physical Exam ?Vitals and nursing note reviewed.  ?Constitutional:   ?   General: She is not in acute distress. ?   Appearance: She is well-developed.  ?HENT:  ?   Head: Normocephalic and atraumatic.  ?   Nose: Nose normal.  ?Eyes:  ?   General: No scleral icterus.    ?   Left eye: No discharge.  ?   Conjunctiva/sclera: Conjunctivae normal.  ?Cardiovascular:  ?   Rate and Rhythm: Normal rate and regular rhythm.  ?   Heart sounds: Normal heart sounds. No murmur heard. ?  No friction rub. No gallop.  ?Pulmonary:  ?   Effort: Pulmonary effort is normal. No respiratory  distress.  ?   Breath sounds: Normal breath sounds.  ?Abdominal:  ?   General: Bowel sounds are normal. There is no distension.  ?   Palpations: Abdomen is soft.  ?   Tenderness: There is no abdominal tenderness. There is no guarding.  ?Musculoskeletal:     ?   General: Normal range of motion.  ?   Cervical back: Normal range of motion and neck supple.  ?Skin: ?   General: Skin is warm and dry.  ?   Findings: No rash.  ?Neurological:  ?   Mental Status: She is alert and oriented to person, place, and time.  ?   Cranial Nerves: No cranial nerve deficit.  ?   Sensory: No sensory deficit.  ?   Motor: No weakness or abnormal muscle tone.  ?   Coordination: Coordination normal.  ?   Comments: Pupils reactive. No facial asymmetry noted. Cranial nerves appear grossly intact. Sensation intact to light touch on face, BUE and BLE. Strength 5/5 in BUE and BLE.   ? ? ?ED Results / Procedures / Treatments   ?Labs ?(all labs ordered are listed, but only abnormal results are displayed) ?Labs Reviewed - No data to display ? ?EKG ?None ? ?Radiology ?No results found. ? ?Procedures ?Procedures  ? ? ?Medications Ordered in ED ?Medications - No data to display ? ?ED Course/ Medical Decision Making/ A&P ?  ?                        ?Medical Decision Making ? ?71 year old female presenting to the ED for hypertension.  Apparently she was sent by PCP today due to facial asymmetry.  She is hypertensive to 408 systolic upon arrival.  Patient states that she feels at her baseline and is declining work-up.  I did perform a neurological exam and she has no facial asymmetry although her tongue is slightly deviated to the right upon protrusion.  Pupils equal and reactive to light bilaterally.  She has strength and sensation intact of bilateral upper and lower extremities.  Normal sensation to light touch of face.  She is ambulatory here at baseline with her cane.  I stressed the importance of work-up here to exclude any life-threatening or limb  threatening condition prior to discharge.  Patient states that she does not want to stay as she has "been at the doctor's office all day and want to go home."  Reiterated that at the very least we can make sure we are able to control her blood pressure but she continues to decline ? ?I have discussed my concerns as a provider and the possibility that this may worsen. We discussed the nature, risks and benefits, and alternatives to treatment. I have  specifically discussed that without further evaluation I cannot guarantee there is not a life threatening event occuring.  Time was given to allow the opportunity to ask questions and consider the options, and after the discussion, the patient decided to refuse the offered treatment. Patient is alert and oriented x4, their own POA and states understanding of my concerns and the possible consequences. After refusal, I made every reasonable opportunity to treat them to the best of my ability. I have made the patient aware that this is an [facility] discharge, but he may return at any time for further evaluation and treatment. ? ? ? ?Portions of this note were generated with [employer]. Dictation errors may occur despite best attempts at proofreading. ? ? ? ? ? ? ? ? ?Final Clinical Impression(s) / ED Diagnoses ?Final diagnoses:  ?None  ? ? ?Rx / DC Orders ?ED Discharge Orders   ? ? None  ? ?  ? ? ?  Delia Heady, PA-C ?07/28/21 1535 ? ?  ?Margette Fast, MD ?08/05/21 [phone] ? ?

## 2021-07-28 NOTE — ED Triage Notes (Signed)
She was seen in the ER last week with HTN. She was seen by her PCP today and told to come here due to asymmetrical face. Pt is alert oriented. States she feels fine. No last known normal. EKG at triage. ?

## 2021-07-28 NOTE — Discharge Instructions (Addendum)
You are choosing to leave [facility] today. ?You can return at any time to seek treatment. ?I am unable to perform any labs or imaging today to rule out any life-threatening process. ?Any undiagnosed conditions may become life or limb threatening. ?Return to the ER if you start to experience worsening symptoms. ?

## 2021-07-28 NOTE — ED Notes (Signed)
PT SEEN AT SPOKEN WITH  PA and then she left, I never saw the pt ?

## 2021-09-22 ENCOUNTER — Other Ambulatory Visit: Payer: Self-pay | Admitting: Family Medicine

## 2021-09-22 DIAGNOSIS — Z1231 Encounter for screening mammogram for malignant neoplasm of breast: Secondary | ICD-10-CM

## 2021-10-17 ENCOUNTER — Ambulatory Visit
Admission: RE | Admit: 2021-10-17 | Discharge: 2021-10-17 | Disposition: A | Discharge status: Home / Self Care | Payer: Medicare Other | Source: Ambulatory Visit | Attending: Family Medicine | Admitting: Family Medicine

## 2021-10-17 ENCOUNTER — Other Ambulatory Visit: Payer: Self-pay | Admitting: Nurse Practitioner

## 2021-10-17 DIAGNOSIS — Z1231 Encounter for screening mammogram for malignant neoplasm of breast: Secondary | ICD-10-CM

## 2022-07-09 ENCOUNTER — Emergency Department (HOSPITAL_BASED_OUTPATIENT_CLINIC_OR_DEPARTMENT_OTHER): Payer: 59

## 2022-07-09 ENCOUNTER — Other Ambulatory Visit: Payer: Self-pay

## 2022-07-09 ENCOUNTER — Encounter (HOSPITAL_BASED_OUTPATIENT_CLINIC_OR_DEPARTMENT_OTHER): Payer: Self-pay | Admitting: Emergency Medicine

## 2022-07-09 ENCOUNTER — Emergency Department (HOSPITAL_BASED_OUTPATIENT_CLINIC_OR_DEPARTMENT_OTHER)
Admission: EM | Admit: 2022-07-09 | Discharge: 2022-07-09 | Discharge status: Against Medical Advice | Payer: 59 | Attending: Emergency Medicine | Admitting: Emergency Medicine

## 2022-07-09 DIAGNOSIS — M79606 Pain in leg, unspecified: Secondary | ICD-10-CM | POA: Diagnosis present

## 2022-07-09 DIAGNOSIS — Z5321 Procedure and treatment not carried out due to patient leaving prior to being seen by health care provider: Secondary | ICD-10-CM | POA: Insufficient documentation

## 2022-07-09 LAB — CBG MONITORING, ED: Glucose-Capillary: 87 mg/dL (ref 70–99)

## 2022-07-09 NOTE — ED Triage Notes (Signed)
Pt states she has "bumps" on the back of her calves.  Pt has been having some leg pain over the last year and has been seen for same.

## 2022-09-14 ENCOUNTER — Other Ambulatory Visit: Payer: Self-pay | Admitting: Nurse Practitioner

## 2022-09-14 DIAGNOSIS — Z1231 Encounter for screening mammogram for malignant neoplasm of breast: Secondary | ICD-10-CM

## 2022-10-20 ENCOUNTER — Ambulatory Visit
Admission: RE | Admit: 2022-10-20 | Discharge: 2022-10-20 | Disposition: A | Discharge status: Home / Self Care | Payer: 59 | Source: Ambulatory Visit | Attending: Nurse Practitioner | Admitting: Nurse Practitioner

## 2022-10-20 DIAGNOSIS — Z1231 Encounter for screening mammogram for malignant neoplasm of breast: Secondary | ICD-10-CM

## 2024-02-13 ENCOUNTER — Other Ambulatory Visit: Payer: Self-pay | Admitting: Nurse Practitioner

## 2024-02-13 DIAGNOSIS — Z1231 Encounter for screening mammogram for malignant neoplasm of breast: Secondary | ICD-10-CM

## 2024-02-26 ENCOUNTER — Ambulatory Visit
Admission: RE | Admit: 2024-02-26 | Discharge: 2024-02-26 | Disposition: A | Discharge status: Home / Self Care | Source: Ambulatory Visit | Attending: Nurse Practitioner | Admitting: Nurse Practitioner

## 2024-02-26 DIAGNOSIS — Z1231 Encounter for screening mammogram for malignant neoplasm of breast: Secondary | ICD-10-CM
